# Patient Record
Sex: Female | Born: 1955 | Race: Black or African American | Hispanic: No | Marital: Single | Smoking: Never smoker
Health system: Southern US, Community
[De-identification: ages and names within clinical notes are randomized; demographics above are authoritative.]

## PROBLEM LIST (undated history)

## (undated) DIAGNOSIS — J45909 Unspecified asthma, uncomplicated: Secondary | ICD-10-CM

## (undated) DIAGNOSIS — I10 Essential (primary) hypertension: Secondary | ICD-10-CM

## (undated) HISTORY — PX: ABDOMINAL HYSTERECTOMY: SHX81

---

## 2002-01-13 DIAGNOSIS — C801 Malignant (primary) neoplasm, unspecified: Secondary | ICD-10-CM

## 2002-01-13 HISTORY — DX: Malignant (primary) neoplasm, unspecified: C80.1

## 2018-10-26 ENCOUNTER — Emergency Department (HOSPITAL_BASED_OUTPATIENT_CLINIC_OR_DEPARTMENT_OTHER): Payer: Commercial Managed Care - PPO

## 2018-10-26 ENCOUNTER — Emergency Department (HOSPITAL_BASED_OUTPATIENT_CLINIC_OR_DEPARTMENT_OTHER)
Admission: EM | Admit: 2018-10-26 | Discharge: 2018-10-26 | Disposition: A | Discharge status: Home / Self Care | Payer: Commercial Managed Care - PPO | Attending: Emergency Medicine | Admitting: Emergency Medicine

## 2018-10-26 ENCOUNTER — Encounter (HOSPITAL_BASED_OUTPATIENT_CLINIC_OR_DEPARTMENT_OTHER): Payer: Self-pay

## 2018-10-26 ENCOUNTER — Other Ambulatory Visit: Payer: Self-pay

## 2018-10-26 DIAGNOSIS — J069 Acute upper respiratory infection, unspecified: Secondary | ICD-10-CM | POA: Diagnosis not present

## 2018-10-26 DIAGNOSIS — Z20828 Contact with and (suspected) exposure to other viral communicable diseases: Secondary | ICD-10-CM | POA: Diagnosis not present

## 2018-10-26 DIAGNOSIS — R05 Cough: Secondary | ICD-10-CM | POA: Diagnosis present

## 2018-10-26 MED ORDER — BENZONATATE 100 MG PO CAPS: 100.0000 mg | ORAL_CAPSULE | Freq: Three times a day (TID) | ORAL | 0 refills | Status: DC

## 2018-10-26 NOTE — ED Triage Notes (Signed)
pt c/o flu like sx x 4 days--seen at [facility] last week-rx "steroids"-NAD-steady gait

## 2018-10-26 NOTE — ED Notes (Signed)
Port chest done in 14

## 2018-10-26 NOTE — ED Provider Notes (Signed)
[facility] Provider Note   CSN: [id number] Arrival date & time: 10/26/18  1858     History   Chief Complaint Chief Complaint  Patient presents with  . Cough    HPI Jaime Thomas is a 63 y.o. female.     62 yo F with a chief complaints of cough, congestion and myalgias.  Going on for about 4 days now.  Was seen in urgent care at the onset of this and was prescribed to a steroid Dosepak.  She has not had any improvement with this.  No fevers no shortness of breath no nausea vomiting or diarrhea.  The history is provided by the patient.  Cough Associated symptoms: myalgias   Associated symptoms: no chest pain, no chills, no fever, no headaches, no rhinorrhea, no shortness of breath and no wheezing   Illness Severity:  Moderate Onset quality:  Gradual Duration:  4 days Timing:  Constant Progression:  Worsening Chronicity:  New Associated symptoms: congestion, cough and myalgias   Associated symptoms: no chest pain, no fever, no headaches, no nausea, no rhinorrhea, no shortness of breath, no vomiting and no wheezing     History reviewed. No pertinent past medical history.  There are no active problems to display for this patient.   Past Surgical History:  Procedure Laterality Date  . ABDOMINAL HYSTERECTOMY       OB History   No obstetric history on file.      Home Medications    Prior to Admission medications   Medication Sig Start Date End Date Taking? Authorizing Provider  benzonatate (TESSALON) 100 MG capsule Take 1 capsule (100 mg total) by mouth every 8 (eight) hours. 10/26/18   Deno Etienne, DO    Family History No family history on file.  Social History Social History   Tobacco Use  . Smoking status: Never Smoker  . Smokeless tobacco: Never Used  Substance Use Topics  . Alcohol use: Never    Frequency: Never  . Drug use: Never     Allergies   Patient has no known allergies.   Review of Systems Review of  Systems  Constitutional: Negative for chills and fever.  HENT: Positive for congestion. Negative for rhinorrhea.   Eyes: Negative for redness and visual disturbance.  Respiratory: Positive for cough. Negative for shortness of breath and wheezing.   Cardiovascular: Negative for chest pain and palpitations.  Gastrointestinal: Negative for nausea and vomiting.  Genitourinary: Negative for dysuria and urgency.  Musculoskeletal: Positive for myalgias. Negative for arthralgias.  Skin: Negative for pallor and wound.  Neurological: Negative for dizziness and headaches.     Physical Exam Updated Vital Signs BP (!) 155/98 (BP Location: Left Arm)   Pulse 84   Temp 98.4 F (36.9 C) (Oral)   Resp 20   Ht 5\' 9"  (1.753 m)   Wt 93.4 kg   SpO2 98%   BMI 30.42 kg/m   Physical Exam Vitals signs and nursing note reviewed.  Constitutional:      General: She is not in acute distress.    Appearance: She is well-developed. She is not diaphoretic.  HENT:     Head: Normocephalic and atraumatic.     Comments: Swollen turbinates, posterior nasal drip, no noted sinus ttp, tm normal bilaterally.   Eyes:     Pupils: Pupils are equal, round, and reactive to light.  Neck:     Musculoskeletal: Normal range of motion and neck supple.  Cardiovascular:  Rate and Rhythm: Normal rate and regular rhythm.     Heart sounds: No murmur. No friction rub. No gallop.   Pulmonary:     Effort: Pulmonary effort is normal.     Breath sounds: Rhonchi (lll) present. No wheezing or rales.  Abdominal:     General: There is no distension.     Palpations: Abdomen is soft.     Tenderness: There is no abdominal tenderness.  Musculoskeletal:        General: No tenderness.  Skin:    General: Skin is warm and dry.  Neurological:     Mental Status: She is alert and oriented to person, place, and time.  Psychiatric:        Behavior: Behavior normal.      ED Treatments / Results  Labs (all labs ordered are listed,  but only abnormal results are displayed) Labs Reviewed - No data to display  EKG None  Radiology Dg Chest [facility] 1 View  Result Date: 10/26/2018 CLINICAL DATA:  63 year old female with shortness of breath and abnormal left lower lobe auscultation. EXAM: PORTABLE CHEST 1 VIEW COMPARISON:  None. FINDINGS: Portable AP semi upright view at 2121 hours. Mildly low lung volumes. Mild diffuse increased interstitial markings. Otherwise allowing for portable technique both lungs appear clear. Normal cardiac size and mediastinal contours. Visualized tracheal air column is within normal limits. No pneumothorax. Negative visible osseous structures. IMPRESSION: Mild increased pulmonary interstitial markings which might be chronic. Consider viral/atypical respiratory infection. No other acute cardiopulmonary abnormality. Electronically Signed   By: Genevie Ann M.D.   On: 10/26/2018 21:36    Procedures Procedures (including critical care time)  Medications Ordered in ED Medications - No data to display   Initial Impression / Assessment and Plan / ED Course  I have reviewed the triage vital signs and the nursing notes.  Pertinent labs & imaging results that were available during my care of the patient were reviewed by me and considered in my medical decision making (see chart for details).        63 yo F with a chief complaints of cough congestion and myalgias.  Going on for about 4 days.  Some left lower lobe rhonchi on exam.  Will obtain a plain film.  As this is occurring during the novel coronavirus pandemic this could be possible cause of her symptoms.  We will send an outpatient test.  Patient on reassessment is declining a cover test.  I wonder if there is some sort of stigma that goes along with a positive test.  Her chest x-ray shows a likely viral pneumonia.  No focal infiltrates.  We will have the patient self isolate at home.  PCP follow-up.  [facility] was evaluated in [facility] on  10/26/2018 for the symptoms described in the history of present illness. He/she was evaluated in the context of the global COVID-19 pandemic, which necessitated consideration that the patient might be at risk for infection with the SARS-CoV-2 virus that causes COVID-19. Institutional protocols and algorithms that pertain to the evaluation of patients at risk for COVID-19 are in a state of rapid change based on information released by regulatory bodies including the CDC and federal and state organizations. These policies and algorithms were followed during the patient's care in the ED.  9:54 PM:  I have discussed the diagnosis/risks/treatment options with the patient and family and believe the pt to be eligible for discharge home to follow-up with PCP. We also discussed returning to  the ED immediately if new or worsening sx occur. We discussed the sx which are most concerning (e.g., sudden worsening pain, fever, inability to tolerate by mouth) that necessitate immediate return. Medications administered to the patient during their visit and any new prescriptions provided to the patient are listed below.  Medications given during this visit Medications - No data to display   The patient appears reasonably screen and/or stabilized for discharge and I doubt any other medical condition or other [facility] requiring further screening, evaluation, or treatment in the ED at this time prior to discharge.    Final Clinical Impressions(s) / ED Diagnoses   Final diagnoses:  Viral URI with cough    ED Discharge Orders         Ordered    benzonatate (TESSALON) 100 MG capsule  Every 8 hours     10/26/18 2148           Deno Etienne, DO 10/26/18 2154

## 2018-10-26 NOTE — Discharge Instructions (Signed)
Take tylenol 2 pills 4 times a day and motrin 4 pills 3 times a day.  Drink plenty of fluids.  Return for worsening shortness of breath, headache, confusion. Follow up with your family doctor.   Your chest x-ray looked typical of a viral infection.  There is no obvious pneumonia.  During the middle of the pandemic this is concerning that you could have the novel coronavirus.  Please try to social distance yourself from other people especially those with immune suppression of those at extremes of age.     Person Under Monitoring Name: Jaime [facility]  Location: [address] [place] [id number]   Infection Prevention Recommendations for Individuals Confirmed to have, or Being Evaluated for, 2019 Novel Coronavirus (COVID-19) Infection Who Receive Care at Home  Individuals who are confirmed to have, or are being evaluated for, COVID-19 should follow the prevention steps below until a healthcare provider or local or state health department says they can return to normal activities.  Stay home except to get medical care You should restrict activities outside your home, except for getting medical care. Do not go to work, school, or public areas, and do not use public transportation or taxis.  Call ahead before visiting your doctor Before your medical appointment, call the healthcare provider and tell them that you have, or are being evaluated for, COVID-19 infection. This will help the healthcare providers office take steps to keep other people from getting infected. Ask your healthcare provider to call the local or state health department.  Monitor your symptoms Seek prompt medical attention if your illness is worsening (e.g., difficulty breathing). Before going to your medical appointment, call the healthcare provider and tell them that you have, or are being evaluated for, COVID-19 infection. Ask your healthcare provider to call the local or state health department.  Wear a  facemask You should wear a facemask that covers your nose and mouth when you are in the same room with other people and when you visit a healthcare provider. People who live with or visit you should also wear a facemask while they are in the same room with you.  Separate yourself from other people in your home As much as possible, you should stay in a different room from other people in your home. Also, you should use a separate bathroom, if available.  Avoid sharing household items You should not share dishes, drinking glasses, cups, eating utensils, towels, bedding, or other items with other people in your home. After using these items, you should wash them thoroughly with soap and water.  Cover your coughs and sneezes Cover your mouth and nose with a tissue when you cough or sneeze, or you can cough or sneeze into your sleeve. Throw used tissues in a lined trash can, and immediately wash your hands with soap and water for at least 20 seconds or use an alcohol-based hand rub.  Wash your [facility] your hands often and thoroughly with soap and water for at least 20 seconds. You can use an alcohol-based hand sanitizer if soap and water are not available and if your hands are not visibly dirty. Avoid touching your eyes, nose, and mouth with unwashed hands.   Prevention Steps for Caregivers and Household Members of Individuals Confirmed to have, or Being Evaluated for, COVID-19 Infection Being Cared for in the Home  If you live with, or provide care at home for, a person confirmed to have, or being evaluated for, COVID-19 infection please follow these  guidelines to prevent infection:  Follow healthcare providers instructions Make sure that you understand and can help the patient follow any healthcare provider instructions for all care.  Provide for the patients basic needs You should help the patient with basic needs in the home and provide support for getting groceries,  prescriptions, and other personal needs.  Monitor the patients symptoms If they are getting sicker, call his or her medical provider and tell them that the patient has, or is being evaluated for, COVID-19 infection. This will help the healthcare providers office take steps to keep other people from getting infected. Ask the healthcare provider to call the local or state health department.  Limit the number of people who have contact with the patient If possible, have only one caregiver for the patient. Other household members should stay in another home or place of residence. If this is not possible, they should stay in another room, or be separated from the patient as much as possible. Use a separate bathroom, if available. Restrict visitors who do not have an essential need to be in the home.  Keep older adults, very young children, and other sick people away from the patient Keep older adults, very young children, and those who have compromised immune systems or chronic health conditions away from the patient. This includes people with chronic heart, lung, or kidney conditions, diabetes, and cancer.  Ensure good ventilation Make sure that shared spaces in the home have good air flow, such as from an air conditioner or an opened window, weather permitting.  Wash your hands often Wash your hands often and thoroughly with soap and water for at least 20 seconds. You can use an alcohol based hand sanitizer if soap and water are not available and if your hands are not visibly dirty. Avoid touching your eyes, nose, and mouth with unwashed hands. Use disposable paper towels to dry your hands. If not available, use dedicated cloth towels and replace them when they become wet.  Wear a facemask and gloves Wear a disposable facemask at all times in the room and gloves when you touch or have contact with the patients blood, body fluids, and/or secretions or excretions, such as sweat, saliva,  sputum, nasal mucus, vomit, urine, or feces.  Ensure the mask fits over your nose and mouth tightly, and do not touch it during use. Throw out disposable facemasks and gloves after using them. Do not reuse. Wash your hands immediately after removing your facemask and gloves. If your personal clothing becomes contaminated, carefully remove clothing and launder. Wash your hands after handling contaminated clothing. Place all used disposable facemasks, gloves, and other waste in a lined container before disposing them with other household waste. Remove gloves and wash your hands immediately after handling these items.  Do not share dishes, glasses, or other household items with the patient Avoid sharing household items. You should not share dishes, drinking glasses, cups, eating utensils, towels, bedding, or other items with a patient who is confirmed to have, or being evaluated for, COVID-19 infection. After the person uses these items, you should wash them thoroughly with soap and water.  Wash laundry thoroughly Immediately remove and wash clothes or bedding that have blood, body fluids, and/or secretions or excretions, such as sweat, saliva, sputum, nasal mucus, vomit, urine, or feces, on them. Wear gloves when handling laundry from the patient. Read and follow directions on labels of laundry or clothing items and detergent. In general, wash and dry with the warmest temperatures  recommended on the label.  Clean all areas the individual has used often Clean all touchable surfaces, such as counters, tabletops, doorknobs, bathroom fixtures, toilets, phones, keyboards, tablets, and bedside tables, every day. Also, clean any surfaces that may have blood, body fluids, and/or secretions or excretions on them. Wear gloves when cleaning surfaces the patient has come in contact with. Use a diluted bleach solution (e.g., dilute bleach with 1 part bleach and 10 parts water) or a household disinfectant with a  label that says EPA-registered for coronaviruses. To make a bleach solution at home, add 1 tablespoon of bleach to 1 quart (4 cups) of water. For a larger supply, add  cup of bleach to 1 gallon (16 cups) of water. Read labels of cleaning products and follow recommendations provided on product labels. Labels contain instructions for safe and effective use of the cleaning product including precautions you should take when applying the product, such as wearing gloves or eye protection and making sure you have good ventilation during use of the product. Remove gloves and wash hands immediately after cleaning.  Monitor yourself for signs and symptoms of illness Caregivers and household members are considered close contacts, should monitor their health, and will be asked to limit movement outside of the home to the extent possible. Follow the monitoring steps for close contacts listed on the symptom monitoring form.   ? If you have additional questions, contact your local health department or call the epidemiologist on call at [phone] (available 24/7). ? This guidance is subject to change. For the most up-to-date guidance from [facility], please refer to their website: [email or web address]

## 2018-10-28 LAB — NOVEL CORONAVIRUS, NAA (HOSP ORDER, SEND-OUT TO REF LAB; TAT 18-24 HRS): SARS-CoV-2, NAA: NOT DETECTED

## 2021-01-16 ENCOUNTER — Encounter (HOSPITAL_BASED_OUTPATIENT_CLINIC_OR_DEPARTMENT_OTHER): Payer: Self-pay | Admitting: *Deleted

## 2021-01-16 ENCOUNTER — Other Ambulatory Visit: Payer: Self-pay

## 2021-01-16 ENCOUNTER — Observation Stay (HOSPITAL_BASED_OUTPATIENT_CLINIC_OR_DEPARTMENT_OTHER)
Admission: EM | Admit: 2021-01-16 | Discharge: 2021-01-18 | Disposition: A | Discharge status: Home / Self Care | Payer: Commercial Managed Care - PPO | Attending: Surgery | Admitting: Surgery

## 2021-01-16 DIAGNOSIS — K8019 Calculus of gallbladder with other cholecystitis with obstruction: Secondary | ICD-10-CM | POA: Diagnosis not present

## 2021-01-16 DIAGNOSIS — K819 Cholecystitis, unspecified: Secondary | ICD-10-CM

## 2021-01-16 DIAGNOSIS — F17210 Nicotine dependence, cigarettes, uncomplicated: Secondary | ICD-10-CM | POA: Insufficient documentation

## 2021-01-16 DIAGNOSIS — I10 Essential (primary) hypertension: Secondary | ICD-10-CM | POA: Insufficient documentation

## 2021-01-16 DIAGNOSIS — Z20822 Contact with and (suspected) exposure to covid-19: Secondary | ICD-10-CM | POA: Insufficient documentation

## 2021-01-16 DIAGNOSIS — R1010 Upper abdominal pain, unspecified: Secondary | ICD-10-CM | POA: Diagnosis present

## 2021-01-16 DIAGNOSIS — Z79899 Other long term (current) drug therapy: Secondary | ICD-10-CM | POA: Diagnosis not present

## 2021-01-16 DIAGNOSIS — K8020 Calculus of gallbladder without cholecystitis without obstruction: Secondary | ICD-10-CM

## 2021-01-16 DIAGNOSIS — K5792 Diverticulitis of intestine, part unspecified, without perforation or abscess without bleeding: Secondary | ICD-10-CM | POA: Diagnosis not present

## 2021-01-16 DIAGNOSIS — K8010 Calculus of gallbladder with chronic cholecystitis without obstruction: Secondary | ICD-10-CM | POA: Diagnosis present

## 2021-01-16 HISTORY — DX: Essential (primary) hypertension: I10

## 2021-01-16 LAB — COMPREHENSIVE METABOLIC PANEL
ALT: 14 U/L (ref 0–44)
AST: 17 U/L (ref 15–41)
Albumin: 4.1 g/dL (ref 3.5–5.0)
Alkaline Phosphatase: 87 U/L (ref 38–126)
Anion gap: 14 (ref 5–15)
BUN: 12 mg/dL (ref 8–23)
CO2: 21 mmol/L — ABNORMAL LOW (ref 22–32)
Calcium: 9.6 mg/dL (ref 8.9–10.3)
Chloride: 102 mmol/L (ref 98–111)
Creatinine, Ser: 0.79 mg/dL (ref 0.44–1.00)
GFR, Estimated: >60 mL/min (ref >60)
Glucose, Bld: 144 mg/dL — ABNORMAL HIGH (ref 70–99)
Potassium: 3.2 mmol/L — ABNORMAL LOW (ref 3.5–5.1)
Sodium: 137 mmol/L (ref 135–145)
Total Bilirubin: 0.3 mg/dL (ref 0.3–1.2)
Total Protein: 7.9 g/dL (ref 6.5–8.1)

## 2021-01-16 LAB — CBC
HCT: 41.4 % (ref 36.0–46.0)
Hemoglobin: 14 g/dL (ref 12.0–15.0)
MCH: 26.5 pg (ref 26.0–34.0)
MCHC: 33.8 g/dL (ref 30.0–36.0)
MCV: 78.4 fL — ABNORMAL LOW (ref 80.0–100.0)
Platelets: 344 10*3/uL (ref 150–400)
RBC: 5.28 MIL/uL — ABNORMAL HIGH (ref 3.87–5.11)
RDW: 13.6 % (ref 11.5–15.5)
WBC: 10.2 10*3/uL (ref 4.0–10.5)
nRBC: 0 % (ref 0.0–0.2)

## 2021-01-16 LAB — LIPASE, BLOOD: Lipase: 34 U/L (ref 11–51)

## 2021-01-16 MED ORDER — ONDANSETRON 4 MG PO TBDP
4.0000 mg | ORAL_TABLET | Freq: Once | ORAL | Status: AC
  Administered 2021-01-16: 4 mg via ORAL
  Filled 2021-01-16: qty 1

## 2021-01-16 NOTE — ED Triage Notes (Signed)
C/o abd pain, lower back pain and vomiting x 4 hrs

## 2021-01-17 ENCOUNTER — Observation Stay (HOSPITAL_COMMUNITY): Payer: Commercial Managed Care - PPO | Admitting: Anesthesiology

## 2021-01-17 ENCOUNTER — Inpatient Hospital Stay: Admit: 2021-01-17 | Payer: Commercial Managed Care - PPO | Admitting: Surgery

## 2021-01-17 ENCOUNTER — Encounter (HOSPITAL_COMMUNITY)
Admission: EM | Disposition: A | Discharge status: Home / Self Care | Payer: Self-pay | Source: Home / Self Care | Attending: Emergency Medicine

## 2021-01-17 ENCOUNTER — Emergency Department (HOSPITAL_BASED_OUTPATIENT_CLINIC_OR_DEPARTMENT_OTHER): Payer: Commercial Managed Care - PPO

## 2021-01-17 ENCOUNTER — Encounter (HOSPITAL_COMMUNITY): Payer: Self-pay

## 2021-01-17 DIAGNOSIS — K5792 Diverticulitis of intestine, part unspecified, without perforation or abscess without bleeding: Secondary | ICD-10-CM | POA: Diagnosis not present

## 2021-01-17 DIAGNOSIS — K8010 Calculus of gallbladder with chronic cholecystitis without obstruction: Secondary | ICD-10-CM | POA: Diagnosis present

## 2021-01-17 DIAGNOSIS — I10 Essential (primary) hypertension: Secondary | ICD-10-CM | POA: Diagnosis not present

## 2021-01-17 DIAGNOSIS — F17210 Nicotine dependence, cigarettes, uncomplicated: Secondary | ICD-10-CM | POA: Diagnosis not present

## 2021-01-17 DIAGNOSIS — Z79899 Other long term (current) drug therapy: Secondary | ICD-10-CM | POA: Diagnosis not present

## 2021-01-17 DIAGNOSIS — Z20822 Contact with and (suspected) exposure to covid-19: Secondary | ICD-10-CM | POA: Diagnosis not present

## 2021-01-17 DIAGNOSIS — R1010 Upper abdominal pain, unspecified: Secondary | ICD-10-CM | POA: Diagnosis present

## 2021-01-17 DIAGNOSIS — K8019 Calculus of gallbladder with other cholecystitis with obstruction: Secondary | ICD-10-CM | POA: Diagnosis not present

## 2021-01-17 HISTORY — PX: CHOLECYSTECTOMY: SHX55

## 2021-01-17 LAB — URINALYSIS, MICROSCOPIC (REFLEX)

## 2021-01-17 LAB — URINALYSIS, ROUTINE W REFLEX MICROSCOPIC
Bilirubin Urine: NEGATIVE
Glucose, UA: NEGATIVE mg/dL
Hgb urine dipstick: NEGATIVE
Ketones, ur: NEGATIVE mg/dL
Nitrite: NEGATIVE
Protein, ur: NEGATIVE mg/dL
Specific Gravity, Urine: 1.02 (ref 1.005–1.030)
pH: 8.5 — ABNORMAL HIGH (ref 5.0–8.0)

## 2021-01-17 LAB — HIV ANTIBODY (ROUTINE TESTING W REFLEX): HIV Screen 4th Generation wRfx: NONREACTIVE

## 2021-01-17 LAB — RESP PANEL BY RT-PCR (FLU A&B, COVID) ARPGX2
Influenza A by PCR: NEGATIVE
Influenza B by PCR: NEGATIVE
SARS Coronavirus 2 by RT PCR: NEGATIVE

## 2021-01-17 SURGERY — LAPAROSCOPIC CHOLECYSTECTOMY WITH INTRAOPERATIVE CHOLANGIOGRAM
Anesthesia: General | Site: Abdomen

## 2021-01-17 MED ORDER — ENOXAPARIN SODIUM 40 MG/0.4ML IJ SOSY
40.0000 mg | PREFILLED_SYRINGE | INTRAMUSCULAR | Status: DC
  Administered 2021-01-18: 40 mg via SUBCUTANEOUS
  Filled 2021-01-17: qty 0.4

## 2021-01-17 MED ORDER — 0.9 % SODIUM CHLORIDE (POUR BTL) OPTIME
TOPICAL | Status: DC | PRN
  Administered 2021-01-17: 1000 mL

## 2021-01-17 MED ORDER — FENTANYL CITRATE PF 50 MCG/ML IJ SOSY
75.0000 ug | PREFILLED_SYRINGE | Freq: Once | INTRAMUSCULAR | Status: AC
  Administered 2021-01-17: 75 ug via INTRAVENOUS
  Filled 2021-01-17: qty 2

## 2021-01-17 MED ORDER — FENTANYL CITRATE (PF) 250 MCG/5ML IJ SOLN
INTRAMUSCULAR | Status: DC | PRN
  Administered 2021-01-17 (×5): 50 ug via INTRAVENOUS

## 2021-01-17 MED ORDER — FENTANYL CITRATE (PF) 250 MCG/5ML IJ SOLN
INTRAMUSCULAR | Status: AC
  Filled 2021-01-17: qty 5

## 2021-01-17 MED ORDER — PROPOFOL 10 MG/ML IV BOLUS
INTRAVENOUS | Status: DC | PRN
  Administered 2021-01-17: 150 mg via INTRAVENOUS

## 2021-01-17 MED ORDER — OXYCODONE HCL 5 MG PO TABS: 5.0000 mg | ORAL_TABLET | Freq: Once | ORAL | Status: DC | PRN

## 2021-01-17 MED ORDER — OXYCODONE HCL 5 MG/5ML PO SOLN: 5.0000 mg | Freq: Once | ORAL | Status: DC | PRN

## 2021-01-17 MED ORDER — SODIUM CHLORIDE 0.9 % IV SOLN
2.0000 g | Freq: Once | INTRAVENOUS | Status: AC
  Administered 2021-01-17: 2 g via INTRAVENOUS
  Filled 2021-01-17: qty 20

## 2021-01-17 MED ORDER — DIPHENHYDRAMINE HCL 12.5 MG/5ML PO ELIX: 12.5000 mg | ORAL_SOLUTION | Freq: Four times a day (QID) | ORAL | Status: DC | PRN

## 2021-01-17 MED ORDER — MIDAZOLAM HCL 2 MG/2ML IJ SOLN
INTRAMUSCULAR | Status: DC | PRN
  Administered 2021-01-17: 2 mg via INTRAVENOUS

## 2021-01-17 MED ORDER — DIPHENHYDRAMINE HCL 50 MG/ML IJ SOLN: 12.5000 mg | Freq: Four times a day (QID) | INTRAMUSCULAR | Status: DC | PRN

## 2021-01-17 MED ORDER — HYDROCHLOROTHIAZIDE 25 MG PO TABS
25.0000 mg | ORAL_TABLET | Freq: Every day | ORAL | Status: DC
Start: 2021-01-18 — End: 2021-01-18
  Administered 2021-01-18: 25 mg via ORAL
  Filled 2021-01-17: qty 1

## 2021-01-17 MED ORDER — ONDANSETRON HCL 4 MG/2ML IJ SOLN
4.0000 mg | Freq: Once | INTRAMUSCULAR | Status: AC
  Administered 2021-01-17: 4 mg via INTRAVENOUS
  Filled 2021-01-17: qty 2

## 2021-01-17 MED ORDER — HYDROMORPHONE HCL 1 MG/ML IJ SOLN
0.5000 mg | INTRAMUSCULAR | Status: DC | PRN
  Administered 2021-01-17: 0.5 mg via INTRAVENOUS
  Filled 2021-01-17: qty 1

## 2021-01-17 MED ORDER — PROMETHAZINE HCL 25 MG/ML IJ SOLN: 6.2500 mg | INTRAMUSCULAR | Status: DC | PRN

## 2021-01-17 MED ORDER — HYDROMORPHONE HCL 1 MG/ML IJ SOLN
0.5000 mg | Freq: Once | INTRAMUSCULAR | Status: AC
  Administered 2021-01-17: 0.5 mg via INTRAVENOUS
  Filled 2021-01-17: qty 1

## 2021-01-17 MED ORDER — KCL IN DEXTROSE-NACL 20-5-0.45 MEQ/L-%-% IV SOLN
INTRAVENOUS | Status: DC
  Filled 2021-01-17: qty 1000

## 2021-01-17 MED ORDER — MIDAZOLAM HCL 2 MG/2ML IJ SOLN
INTRAMUSCULAR | Status: AC
  Filled 2021-01-17: qty 2

## 2021-01-17 MED ORDER — TRAZODONE HCL 50 MG PO TABS
50.0000 mg | ORAL_TABLET | Freq: Every day | ORAL | Status: DC
Start: 2021-01-17 — End: 2021-01-18
  Administered 2021-01-17: 50 mg via ORAL
  Filled 2021-01-17: qty 1

## 2021-01-17 MED ORDER — SUGAMMADEX SODIUM 200 MG/2ML IV SOLN
INTRAVENOUS | Status: DC | PRN
  Administered 2021-01-17: 200 mg via INTRAVENOUS

## 2021-01-17 MED ORDER — CHLORHEXIDINE GLUCONATE 0.12 % MT SOLN
15.0000 mL | Freq: Once | OROMUCOSAL | Status: AC
  Administered 2021-01-17: 15 mL via OROMUCOSAL

## 2021-01-17 MED ORDER — ONDANSETRON 4 MG PO TBDP: 4.0000 mg | ORAL_TABLET | Freq: Four times a day (QID) | ORAL | Status: DC | PRN

## 2021-01-17 MED ORDER — SODIUM CHLORIDE 0.9 % IV SOLN: 2.0000 g | INTRAVENOUS | Status: DC

## 2021-01-17 MED ORDER — OXYCODONE HCL 5 MG PO TABS: 5.0000 mg | ORAL_TABLET | ORAL | Status: DC | PRN

## 2021-01-17 MED ORDER — DEXAMETHASONE SODIUM PHOSPHATE 10 MG/ML IJ SOLN
INTRAMUSCULAR | Status: AC
  Filled 2021-01-17: qty 1

## 2021-01-17 MED ORDER — HYDRALAZINE HCL 20 MG/ML IJ SOLN: 10.0000 mg | INTRAMUSCULAR | Status: DC | PRN

## 2021-01-17 MED ORDER — BUPIVACAINE-EPINEPHRINE 0.25% -1:200000 IJ SOLN
INTRAMUSCULAR | Status: DC | PRN
  Administered 2021-01-17: 30 mL

## 2021-01-17 MED ORDER — ONDANSETRON HCL 4 MG/2ML IJ SOLN
INTRAMUSCULAR | Status: DC | PRN
  Administered 2021-01-17: 4 mg via INTRAVENOUS

## 2021-01-17 MED ORDER — HYDROMORPHONE HCL 1 MG/ML IJ SOLN: 0.2500 mg | INTRAMUSCULAR | Status: DC | PRN

## 2021-01-17 MED ORDER — LACTATED RINGERS IR SOLN
Status: DC | PRN
  Administered 2021-01-17: 1000 mL

## 2021-01-17 MED ORDER — LACTATED RINGERS IV SOLN: INTRAVENOUS | Status: DC

## 2021-01-17 MED ORDER — MONTELUKAST SODIUM 10 MG PO TABS
10.0000 mg | ORAL_TABLET | Freq: Every day | ORAL | Status: DC
  Administered 2021-01-17: 10 mg via ORAL
  Filled 2021-01-17: qty 1

## 2021-01-17 MED ORDER — ROCURONIUM BROMIDE 10 MG/ML (PF) SYRINGE
PREFILLED_SYRINGE | INTRAVENOUS | Status: DC | PRN
  Administered 2021-01-17: 10 mg via INTRAVENOUS
  Administered 2021-01-17: 40 mg via INTRAVENOUS

## 2021-01-17 MED ORDER — ALBUTEROL SULFATE (2.5 MG/3ML) 0.083% IN NEBU: 2.5000 mg | INHALATION_SOLUTION | Freq: Four times a day (QID) | RESPIRATORY_TRACT | Status: DC | PRN

## 2021-01-17 MED ORDER — SCOPOLAMINE 1 MG/3DAYS TD PT72: 1.0000 | MEDICATED_PATCH | TRANSDERMAL | Status: DC

## 2021-01-17 MED ORDER — OXYCODONE HCL 5 MG PO TABS
5.0000 mg | ORAL_TABLET | ORAL | Status: DC | PRN
  Administered 2021-01-18: 10 mg via ORAL
  Filled 2021-01-17: qty 2

## 2021-01-17 MED ORDER — SODIUM CHLORIDE 0.9 % IV BOLUS
1000.0000 mL | Freq: Once | INTRAVENOUS | Status: AC
  Administered 2021-01-17: 1000 mL via INTRAVENOUS

## 2021-01-17 MED ORDER — PROPOFOL 10 MG/ML IV BOLUS
INTRAVENOUS | Status: AC
  Filled 2021-01-17: qty 20

## 2021-01-17 MED ORDER — IOHEXOL 300 MG/ML  SOLN
100.0000 mL | Freq: Once | INTRAMUSCULAR | Status: AC | PRN
  Administered 2021-01-17: 100 mL via INTRAVENOUS

## 2021-01-17 MED ORDER — ACETAMINOPHEN 500 MG PO TABS: 1000.0000 mg | ORAL_TABLET | Freq: Once | ORAL | Status: DC

## 2021-01-17 MED ORDER — SODIUM CHLORIDE 0.9 % IV BOLUS (SEPSIS)
1000.0000 mL | Freq: Once | INTRAVENOUS | Status: AC
  Administered 2021-01-17: 1000 mL via INTRAVENOUS

## 2021-01-17 MED ORDER — MIDAZOLAM HCL 2 MG/2ML IJ SOLN: 0.5000 mg | Freq: Once | INTRAMUSCULAR | Status: DC | PRN

## 2021-01-17 MED ORDER — ACETAMINOPHEN 500 MG PO TABS
1000.0000 mg | ORAL_TABLET | Freq: Four times a day (QID) | ORAL | Status: DC
  Administered 2021-01-17 – 2021-01-18 (×3): 1000 mg via ORAL
  Filled 2021-01-17 (×3): qty 2

## 2021-01-17 MED ORDER — MEPERIDINE HCL 50 MG/ML IJ SOLN: 6.2500 mg | INTRAMUSCULAR | Status: DC | PRN

## 2021-01-17 MED ORDER — SODIUM CHLORIDE 0.9 % IV SOLN
1000.0000 mL | INTRAVENOUS | Status: DC
  Administered 2021-01-17: 1000 mL via INTRAVENOUS

## 2021-01-17 MED ORDER — LIDOCAINE 2% (20 MG/ML) 5 ML SYRINGE
INTRAMUSCULAR | Status: DC | PRN
  Administered 2021-01-17: 40 mg via INTRAVENOUS

## 2021-01-17 MED ORDER — SUCCINYLCHOLINE CHLORIDE 200 MG/10ML IV SOSY
PREFILLED_SYRINGE | INTRAVENOUS | Status: DC | PRN
Start: 2021-01-17 — End: 2021-01-17
  Administered 2021-01-17: 80 mg via INTRAVENOUS

## 2021-01-17 MED ORDER — DEXAMETHASONE SODIUM PHOSPHATE 10 MG/ML IJ SOLN
INTRAMUSCULAR | Status: DC | PRN
  Administered 2021-01-17: 10 mg via INTRAVENOUS

## 2021-01-17 MED ORDER — ONDANSETRON HCL 4 MG/2ML IJ SOLN
INTRAMUSCULAR | Status: AC
  Filled 2021-01-17: qty 2

## 2021-01-17 MED ORDER — METRONIDAZOLE 500 MG/100ML IV SOLN
500.0000 mg | Freq: Once | INTRAVENOUS | Status: AC
  Administered 2021-01-17: 500 mg via INTRAVENOUS
  Filled 2021-01-17: qty 100

## 2021-01-17 MED ORDER — ALUM & MAG HYDROXIDE-SIMETH 200-200-20 MG/5ML PO SUSP
30.0000 mL | Freq: Four times a day (QID) | ORAL | Status: DC | PRN
  Administered 2021-01-17: 30 mL via ORAL
  Filled 2021-01-17: qty 30

## 2021-01-17 MED ORDER — LIDOCAINE HCL (PF) 2 % IJ SOLN
INTRAMUSCULAR | Status: AC
  Filled 2021-01-17: qty 5

## 2021-01-17 MED ORDER — ATORVASTATIN CALCIUM 10 MG PO TABS
10.0000 mg | ORAL_TABLET | Freq: Every day | ORAL | Status: DC
  Administered 2021-01-18: 10 mg via ORAL
  Filled 2021-01-17: qty 1

## 2021-01-17 MED ORDER — ROCURONIUM BROMIDE 10 MG/ML (PF) SYRINGE
PREFILLED_SYRINGE | INTRAVENOUS | Status: AC
  Filled 2021-01-17: qty 10

## 2021-01-17 MED ORDER — METRONIDAZOLE 500 MG/100ML IV SOLN: 500.0000 mg | Freq: Two times a day (BID) | INTRAVENOUS | Status: DC

## 2021-01-17 MED ORDER — BUPIVACAINE-EPINEPHRINE (PF) 0.25% -1:200000 IJ SOLN
INTRAMUSCULAR | Status: AC
  Filled 2021-01-17: qty 30

## 2021-01-17 MED ORDER — ONDANSETRON HCL 4 MG/2ML IJ SOLN: 4.0000 mg | Freq: Four times a day (QID) | INTRAMUSCULAR | Status: DC | PRN

## 2021-01-17 MED ORDER — HYDROMORPHONE HCL 1 MG/ML IJ SOLN: 0.5000 mg | INTRAMUSCULAR | Status: DC | PRN

## 2021-01-17 SURGICAL SUPPLY — 42 items
APPLIER CLIP 5 13 M/L LIGAMAX5 (MISCELLANEOUS) ×2
BAG COUNTER SPONGE SURGICOUNT (BAG) IMPLANT
CHLORAPREP W/TINT 26 (MISCELLANEOUS) ×2 IMPLANT
CLIP APPLIE 5 13 M/L LIGAMAX5 (MISCELLANEOUS) ×1 IMPLANT
COVER SURGICAL LIGHT HANDLE (MISCELLANEOUS) ×2 IMPLANT
DECANTER SPIKE VIAL GLASS SM (MISCELLANEOUS) ×2 IMPLANT
DERMABOND ADVANCED (GAUZE/BANDAGES/DRESSINGS) ×1
DERMABOND ADVANCED .7 DNX12 (GAUZE/BANDAGES/DRESSINGS) ×1 IMPLANT
DRAPE C-ARM 42X120 X-RAY (DRAPES) IMPLANT
DRAPE SHEET LG 3/4 BI-LAMINATE (DRAPES) IMPLANT
ELECT L-HOOK LAP 45CM DISP (ELECTROSURGICAL)
ELECT PENCIL ROCKER SW 15FT (MISCELLANEOUS) ×2 IMPLANT
ELECT REM PT RETURN 15FT ADLT (MISCELLANEOUS) ×2 IMPLANT
ELECTRODE L-HOOK LAP 45CM DISP (ELECTROSURGICAL) IMPLANT
ENDOLOOP SUT PDS II  0 18 (SUTURE) ×2
ENDOLOOP SUT PDS II 0 18 (SUTURE) IMPLANT
GLOVE SURG POLYISO LF SZ5.5 (GLOVE) ×2 IMPLANT
GLOVE SURG UNDER POLY LF SZ6 (GLOVE) ×2 IMPLANT
GOWN STRL REUS W/TWL LRG LVL3 (GOWN DISPOSABLE) ×2 IMPLANT
GOWN STRL REUS W/TWL XL LVL3 (GOWN DISPOSABLE) ×4 IMPLANT
GRASPER SUT TROCAR 14GX15 (MISCELLANEOUS) IMPLANT
HEMOSTAT SNOW SURGICEL 2X4 (HEMOSTASIS) IMPLANT
IRRIG SUCT STRYKERFLOW 2 WTIP (MISCELLANEOUS) ×2
IRRIGATION SUCT STRKRFLW 2 WTP (MISCELLANEOUS) ×1 IMPLANT
KIT BASIN OR (CUSTOM PROCEDURE TRAY) ×2 IMPLANT
KIT TURNOVER KIT A (KITS) ×1 IMPLANT
L-HOOK LAP DISP 36CM (ELECTROSURGICAL) ×2
LHOOK LAP DISP 36CM (ELECTROSURGICAL) ×1 IMPLANT
NDL INSUFFLATION 14GA 120MM (NEEDLE) IMPLANT
NEEDLE INSUFFLATION 14GA 120MM (NEEDLE) IMPLANT
POUCH SPECIMEN RETRIEVAL 10MM (ENDOMECHANICALS) ×2 IMPLANT
SCISSORS LAP 5X35 DISP (ENDOMECHANICALS) ×2 IMPLANT
SET CHOLANGIOGRAPH MIX (MISCELLANEOUS) IMPLANT
SET TUBE SMOKE EVAC HIGH FLOW (TUBING) ×2 IMPLANT
SLEEVE XCEL OPT CAN 5 100 (ENDOMECHANICALS) ×4 IMPLANT
SUT MNCRL AB 4-0 PS2 18 (SUTURE) ×2 IMPLANT
TOWEL OR 17X26 10 PK STRL BLUE (TOWEL DISPOSABLE) ×2 IMPLANT
TOWEL OR NON WOVEN STRL DISP B (DISPOSABLE) IMPLANT
TRAY LAPAROSCOPIC (CUSTOM PROCEDURE TRAY) ×2 IMPLANT
TROCAR BLADELESS OPT 5 100 (ENDOMECHANICALS) ×2 IMPLANT
TROCAR XCEL 12X100 BLDLESS (ENDOMECHANICALS) IMPLANT
TROCAR XCEL BLUNT TIP 100MML (ENDOMECHANICALS) IMPLANT

## 2021-01-17 NOTE — ED Provider Notes (Signed)
Patient transferred from [facility] for general surgery evaluation.  Imaging significant for cholecystitis, diverticulitis.  She has received antibiotics prior to ED transfer.  On examination she does have focal right upper quadrant tenderness, declines pain meds at this time.  General surgery consulted for further management.   Quintella Reichert, MD 01/17/21 0830

## 2021-01-17 NOTE — Anesthesia Procedure Notes (Signed)
Procedure Name: Intubation Date/Time: 01/17/2021 12:04 PM Performed by: Sharlette Dense, CRNA Pre-anesthesia Checklist: Patient identified, Emergency Drugs available, Suction available and Patient being monitored Patient Re-evaluated:Patient Re-evaluated prior to induction Oxygen Delivery Method: Circle system utilized Preoxygenation: Pre-oxygenation with 100% oxygen Induction Type: IV induction, Rapid sequence and Cricoid Pressure applied Ventilation: Mask ventilation without difficulty Laryngoscope Size: Miller and 2 Grade View: Grade I Tube type: Oral Tube size: 7.5 mm Number of attempts: 1 Airway Equipment and Method: Stylet and Oral airway Placement Confirmation: ETT inserted through vocal cords under direct vision, positive ETCO2 and breath sounds checked- equal and bilateral Secured at: 22 cm Tube secured with: Tape Dental Injury: Teeth and Oropharynx as per pre-operative assessment

## 2021-01-17 NOTE — H&P (Addendum)
Jaime Thomas 01-Nov-1955  [id number].    Requesting MD: Ralene Bathe, MD Chief Complaint/Reason for Consult: abdominal pain, cholelithiasis   HPI:  Jaime Thomas is a 66 y/o F with a PMH HTN, HLD, asthma, and diverticulitis who presented to [facility] with a cc abdominal pain. She states that yesterday she ate a bacon cheeseburger for breakfast and then in the later afternoon she started having back pain. She thought it was gas so she took some gas medication with no relief. After this she reports significant nausea and non-bilious vomiting along with sharp, constant, upper abdominal pain. She denies alleviating factors. Denies similar pain in the past. Denies associated fever, chills, diarrhea, or constipation. Denies recent travel, new foods, or sick contacts. States she was diagnosed with diverticulitis and constipation at an urgent care 2-3 months ago and the pain she has now is very different from the lower abdominal pain she experienced with diverticulitis. She states she has never had a colonoscopy. Denies a known personal or family history of colon cancer.   Social Hx: smokes 5 cigarettes daily. Denies alcohol or other drug use. Currently employed at a desk job.  Surgical Hx: Hysterectomy, umbilical hernia repair with mesh over 20 years ago in [place].   ROS: Review of Systems  All other systems reviewed and are negative.  No family history on file.  History reviewed. No pertinent past medical history.  Past Surgical History:  Procedure Laterality Date   ABDOMINAL HYSTERECTOMY      Social History:  reports that she has never smoked. She has never used smokeless tobacco. She reports that she does not drink alcohol and does not use drugs.  Allergies: No Known Allergies  (Not in a hospital admission)  Physical Exam: Blood pressure (!) 158/94, pulse 89, temperature 98.4 F (36.9 C), resp. rate 15, height 5\' 9"  (1.753 m), weight 94.8 kg, SpO2 95 %. General: Pleasant  female laying on hospital bed, appears stated age, NAD. HEENT: head -normocephalic, atraumatic; Eyes: PERRLA, no conjunctival injection; Ears- no external lesions or tenderness, Throat: pink mucosa, uvula midline, no exudates.  Neck- Trachea is midline, no thyromegaly CV- RRR, normal S1/S2, no M/R/G, radial and dorsalis pedis pulses 2+ BL Pulm- breathing is non-labored ORA. CTABL, no wheezes, rhales, rhonchi. Abd- soft, non-distended, TTP epigastrium and RUQ with voluntary guarding. No peritonitis. no masses, hernias, or organomegaly. GU- deferred  MSK- UE/LE symmetrical, no cyanosis, clubbing, or edema. Neuro- CN II-XII grossly in tact, no paresthesias. Psych- Alert and Oriented x3 with appropriate affect Skin: warm and dry, no rashes or lesions   Results for orders placed or performed during the hospital encounter of 01/16/21 (from the past 48 hour(s))  Lipase, blood     Status: None   Collection Time: 01/16/21 10:13 PM  Result Value Ref Range   Lipase 34 11 - 51 U/L    Comment: Performed at [facility], [address] [place], [place] [address]  Comprehensive metabolic panel     Status: Abnormal   Collection Time: 01/16/21 10:13 PM  Result Value Ref Range   Sodium 137 135 - 145 mmol/L   Potassium 3.2 (L) 3.5 - 5.1 mmol/L   Chloride 102 98 - 111 mmol/L   CO2 21 (L) 22 - 32 mmol/L   Glucose, Bld 144 (H) 70 - 99 mg/dL    Comment: Glucose reference range applies only to samples taken after fasting for at least 8 hours.   BUN 12  8 - 23 mg/dL   Creatinine, Ser 0.79 0.44 - 1.00 mg/dL   Calcium 9.6 8.9 - 10.3 mg/dL   Total Protein 7.9 6.5 - 8.1 g/dL   Albumin 4.1 3.5 - 5.0 g/dL   AST 17 15 - 41 U/L   ALT 14 0 - 44 U/L   Alkaline Phosphatase 87 38 - 126 U/L   Total Bilirubin 0.3 0.3 - 1.2 mg/dL   GFR, Estimated >60 >60 mL/min    Comment: (NOTE) Calculated using the CKD-EPI Creatinine Equation (2021)    Anion gap 14 5 - 15    Comment: Performed at [facility], [address] [place], [place] [address]  CBC     Status: Abnormal   Collection Time: 01/16/21 10:13 PM  Result Value Ref Range   WBC 10.2 4.0 - 10.5 K/uL   RBC 5.28 (H) 3.87 - 5.11 MIL/uL   Hemoglobin 14.0 12.0 - 15.0 g/dL   HCT 41.4 36.0 - 46.0 %   MCV 78.4 (L) 80.0 - 100.0 fL   MCH 26.5 26.0 - 34.0 pg   MCHC 33.8 30.0 - 36.0 g/dL   RDW 13.6 11.5 - 15.5 %   Platelets 344 150 - 400 K/uL   nRBC 0.0 0.0 - 0.2 %    Comment: Performed at [facility], [address] [place], [place] [address]  Urinalysis, Routine w reflex microscopic Urine, Clean Catch     Status: Abnormal   Collection Time: 01/17/21  3:22 AM  Result Value Ref Range   Color, Urine YELLOW YELLOW   APPearance CLOUDY (A) CLEAR   Specific Gravity, Urine 1.020 1.005 - 1.030   pH 8.5 (H) 5.0 - 8.0   Glucose, UA NEGATIVE NEGATIVE mg/dL   Hgb urine dipstick NEGATIVE NEGATIVE   Bilirubin Urine NEGATIVE NEGATIVE   Ketones, ur NEGATIVE NEGATIVE mg/dL   Protein, ur NEGATIVE NEGATIVE mg/dL   Nitrite NEGATIVE NEGATIVE   Leukocytes,Ua SMALL (A) NEGATIVE    Comment: Performed at [facility], [address] [place], [place] [address]  Urinalysis, Microscopic (reflex)     Status: Abnormal   Collection Time: 01/17/21  3:22 AM  Result Value Ref Range   RBC / HPF 0-5 0 - 5 RBC/hpf   WBC, UA 6-10 0 - 5 WBC/hpf   Bacteria, UA MANY (A) NONE SEEN   Squamous Epithelial / LPF 0-5 0 - 5   Amorphous Crystal PRESENT     Comment: Performed at [facility], [address] [place], [place] [address]  Resp Panel by RT-PCR (Flu A&B, Covid) Nasopharyngeal Swab     Status: None   Collection Time: 01/17/21  5:30 AM   Specimen: Nasopharyngeal Swab; Nasopharyngeal(NP) swabs in vial transport medium  Result Value Ref Range   SARS Coronavirus 2 by RT PCR NEGATIVE NEGATIVE    Comment: (NOTE) SARS-CoV-2 target nucleic acids are NOT DETECTED.  The SARS-CoV-2 RNA is generally detectable in upper  respiratory specimens during the acute phase of infection. The lowest concentration of SARS-CoV-2 viral copies this assay can detect is 138 copies/mL. A negative result does not preclude SARS-Cov-2 infection and should not be used as the sole basis for treatment or other patient management decisions. A negative result may occur with  improper specimen collection/handling, submission of specimen other than nasopharyngeal swab, presence of viral mutation(s) within the areas targeted by this assay, and inadequate number of viral copies(<138 copies/mL). A negative result must  be combined with clinical observations, patient history, and epidemiological information. The expected result is Negative.  Fact Sheet for Patients:  EntrepreneurPulse.com.au  Fact Sheet for Healthcare Providers:  IncredibleEmployment.be  This test is no t yet approved or cleared by the [place] FDA and  has been authorized for detection and/or diagnosis of SARS-CoV-2 by FDA under an Emergency Use Authorization (EUA). This EUA will remain  in effect (meaning this test can be used) for the duration of the COVID-19 declaration under Section 564(b)(1) of the Act, 21 U.S.C.section 360bbb-3(b)(1), unless the authorization is terminated  or revoked sooner.       Influenza A by PCR NEGATIVE NEGATIVE   Influenza B by PCR NEGATIVE NEGATIVE    Comment: (NOTE) The Xpert Xpress SARS-CoV-2/FLU/RSV plus assay is intended as an aid in the diagnosis of influenza from Nasopharyngeal swab specimens and should not be used as a sole basis for treatment. Nasal washings and aspirates are unacceptable for Xpert Xpress SARS-CoV-2/FLU/RSV testing.  Fact Sheet for Patients: EntrepreneurPulse.com.au  Fact Sheet for Healthcare Providers: IncredibleEmployment.be  This test is not yet approved or cleared by the [place] FDA and has been authorized for  detection and/or diagnosis of SARS-CoV-2 by FDA under an Emergency Use Authorization (EUA). This EUA will remain in effect (meaning this test can be used) for the duration of the COVID-19 declaration under Section 564(b)(1) of the Act, 21 U.S.C. section 360bbb-3(b)(1), unless the authorization is terminated or revoked.  Performed at [facility], [address] [place], [place] [address]    CT ABDOMEN PELVIS W CONTRAST  Result Date: 01/17/2021 CLINICAL DATA:  66 year old female with abdominal and low back pain with vomiting. EXAM: CT ABDOMEN AND PELVIS WITH CONTRAST TECHNIQUE: Multidetector CT imaging of the abdomen and pelvis was performed using the standard protocol following bolus administration of intravenous contrast. CONTRAST:  113mL OMNIPAQUE IOHEXOL 300 MG/ML  SOLN COMPARISON:  Portable chest 10/26/2018. FINDINGS: Lower chest: Cardiac size at the upper limits of normal. No pericardial effusion. Mosaic attenuation in both lower lungs, favored to be chronic with some evidence of underlying chronic subpleural scarring. No pleural effusion. Hepatobiliary: Extensive lipid laden gallstones throughout the gallbladder, and appear especially concentrated at porta hepatis on coronal image 59. Individual stone size estimated at 12 mm. The gallbladder Hesketh appears indistinct on series 2, image 35 and coronal image 70. There is mild hepatic periportal edema or intrahepatic mild ductal enlargement. The CBD is at the upper limits of normal. Liver enhancement maintained. Pancreas: Negative. Spleen: Negative. Adrenals/Urinary Tract: Negative. No nephrolithiasis or obstructive uropathy. Bladder remains within normal limits. Stomach/Bowel: Mostly decompressed rectum does contain a small volume of fluid. Abnormal sigmoid colon in the central pelvis and along the left pelvic side Heinbaugh with moderate diverticulosis, indistinct sigmoid Dudash thickening. Mesenteric inflammation inseparable from the left adnexa  on coronal image 34. No extraluminal gas or discrete fluid. Upstream the splenic flexure and descending colon also appear mildly thickened and indistinct but there is no associated mesenteric inflammation. Diverticulosis continues in those segments. Transverse and right colon have a more normal appearance with retained stool. Normal appendix on coronal image 52. Negative terminal ileum. No dilated small bowel. Stomach and duodenum are within normal limits. No free air or free fluid. Vascular/Lymphatic: Mild Aortoiliac calcified atherosclerosis. Major arterial structures are patent. No lymphadenopathy. Portal venous system is patent. Reproductive: Surgically absent uterus. The left adnexa is inseparable from the abnormal sigmoid colon and appears secondarily inflamed on coronal image 34,  with left pelvic side Milson soft tissue thickening and stranding on series 2, image 73. Right adnexa within normal limits. Other: No pelvic free fluid. Musculoskeletal: Lower lumbar facet degeneration. No acute or suspicious osseous lesion. IMPRESSION: 1. Abnormal descending and sigmoid colon compatible with Acute Diverticulitis/Colitis. Inflammation maximal in the mid sigmoid which appears adhered to the left adnexa which is secondarily inflamed along the pelvic side Mayer. But no associated perforation, abscess, or other complicating features. 2. But also abnormal Liver and Gallbladder. Numerous lipid laden gallstones, with possible impacted stones at the gallbladder neck. Mild gallbladder inflammation suspected and early biliary ductal dilatation. Follow-up Right Upper Quadrant Ultrasound may be valuable. 3. Chronic lung disease suspected at the lung bases. Aortic Atherosclerosis (ICD10-I70.0). Electronically Signed   By: Genevie Ann M.D.   On: 01/17/2021 04:40      Assessment/Plan Symptomatic cholelithiasis, suspect acute calculous cholecystitis  Patient with acute onset back, epigastric, and RUQ pain after eating a cheeseburger.  Associated with nausea and vomiting. Pain temporarily relieved by pain medication but when the medicine wears off her pain persists. She has multiple gallstones and mild Reetz thickening on CT. WBC and LFTs were WNL yesterday. High suspicion for acute cholecystitis. May have chronic cholecystitis as well. Recommend NPO, IV abx, and laparoscopic cholecystectomy with possible IOC today by Dr. Zenia Resides. Will discuss timing of surgery with Dr. Zenia Resides.  The risks of surgery including bleeding, infection, conversion to open, damage to surrounding structures, drain placement, need for additional surgeries/procedures, as well as the cardiac and pulmonary risks of general anesthesia were discussed with the patient and she would like to proceed with surgery. I welcomed the patients questions and answered them. Her daughter, Lavella Lemons, is her contact person [phone].  Uncomplicated diverticulitis- clinically she is asymptomatic. She is afebrile, no leukocytosis, no lower abdominal pain or significant changes in bowel habits. Her CT scan shows  Vandervoort thickening of the descending and sigmoid colon without fat stranding or mesenteric edema. Recommend IV abx for now. outpatient referral to GI for colonoscopy.    Asthma - PRN albuterol nebs HTN - PRN hydralazine  HLD Tobacco use  FEN - NPO, IVF VTE - SCD's, hold chemical VTE for surgery  ID - Rocephin/Flagyl 1/5 >>  Admit - to [facility] service for cholecystectomy and observation   Moderate Medical Decision Making  Jill Alexanders, [facility] 01/17/2021, 8:32 AM Please see Amion for pager number during day hours 7:00am-4:30pm or 7:00am -11:30am on weekends

## 2021-01-17 NOTE — ED Notes (Signed)
Pt given toothbrush and CHG clothes to prepare for surgery.

## 2021-01-17 NOTE — Op Note (Signed)
Date: 01/17/21  Patient: Jaime Thomas MRN: [id number]  Preoperative Diagnosis: Acute calculous cholecystitis Postoperative Diagnosis: Same  Procedure: Laparoscopic cholecystectomy  Surgeon: Michaelle Birks, MD Assistant: Richard Miu, PA-C  EBL: 50 mL  Anesthesia: General endotracheal  Specimens: Gallbladder  Indications: Jaime Thomas is a 66 year old female who presented with acute onset right upper quadrant abdominal pain associated with nausea and vomiting.  Her symptoms began yesterday and have increasingly worsened.  A CT scan showed gallbladder Loewen thickening and mild pericholecystic fluid, with numerous gallstones.  After discussion of the risks and benefits of surgery, cholecystectomy was recommended and she agreed to proceed.  Findings: Significant gallbladder Fotheringham edema consistent with acute cholecystitis.  Procedure details: Informed consent was obtained in the preoperative area prior to the procedure. The patient was brought to the operating room and placed on the table in the supine position. General anesthesia was induced and appropriate lines and drains were placed for intraoperative monitoring. Perioperative antibiotics were administered per SCIP guidelines. The abdomen was prepped and draped in the usual sterile fashion. A pre-procedure timeout was taken verifying patient identity, surgical site and procedure to be performed.  The patient reported a prior history of umbilical hernia repair with mesh placement, thus the decision was made to enter the abdomen at [place].  A small incision was made in the left upper quadrant and a Veress needle was inserted through the fascia.  Intraperitoneal placement was confirmed with the saline drop test and the abdomen was insufflated.  A 5 mm port was placed and the abdomen was inspected with no evidence of visceral or vascular injury.  The umbilical area was visualized and there was no visible mesh.  An infraumbilical skin incision was  made, the subcutaneous tissue was divided with cautery, and an 72mm trocar was placed under direct visualization.  The scope was moved to the umbilical port, and two 46mm ports were placed in the right subcostal margin under direct visualization.  The gallbladder was very distended and difficult to grasp and so it was needle decompressed to aid in retraction.  The Garver was thickened and edematous, consistent with acute cholecystitis.  The fundus of the gallbladder was grasped and retracted cephalad. The infundibulum was retracted laterally. The cystic triangle was dissected out using cautery and blunt dissection, and the critical view of safety was obtained. The cystic cystic artery was clipped and ligated.  The posterior branch of the cystic artery was inadvertently divided, and hemostasis was achieved with clip placement.  The cystic duct was dilated and there was inflammatory tissue in the cystic triangle that appeared to be tenting the common bile duct up toward the gallbladder.  However there was a clear critical view of safety and the decision was made to divide the cystic duct very close to the gallbladder.  There were some stones lodged in the neck of the gallbladder at the juncture with the cystic duct, and these were milked up into the gallbladder.  The cystic duct was sharply transected and the cystic duct stump was closed with a PDS Endoloop.  The gallbladder was taken off the liver using cautery. The specimen was placed in an endocatch bag and removed. The surgical site was irrigated with saline until the effluent was clear. Hemostasis was achieved in the gallbladder fossa using cautery. The cystic duct and artery stumps were visually inspected and there was no evidence of bile leak or bleeding. The ports were removed under direct visualization and the abdomen was desufflated. The umbilical port  site fascia was closed with a 0 vicryl suture. The skin at all port sites was closed with 4-0 monocryl  subcuticular suture. Dermabond was applied.  The patient tolerated the procedure well with no apparent complications.  All counts were correct x2 at the end of the procedure. The patient was extubated and taken to PACU in stable condition.  Michaelle Birks, MD 01/17/21 3:39 PM

## 2021-01-17 NOTE — ED Provider Notes (Signed)
[facility] Provider Note  CSN: [id number] Arrival date & time: 01/16/21 2138  Chief Complaint(s) Abdominal Pain  HPI Jaime Thomas is a 66 y.o. female with a past medical history of hypertension, hyperlipidemia who was recently diagnosed with diverticulitis 2 months ago.  She presents today for several hours of gradually worsening upper abdominal pain with associated nausea and nonbloody nonbilious emesis.  No known suspicious food intake.  No associated diarrhea.  No urinary symptoms.  Pain has significantly worsened throughout the time worse with emesis and palpation of the upper abdomen.  No chest pain or shortness of breath.   Abdominal Pain  Past Medical History History reviewed. No pertinent past medical history. There are no problems to display for this patient.  Home Medication(s) Prior to Admission medications   Medication Sig Start Date End Date Taking? Authorizing Provider  benzonatate (TESSALON) 100 MG capsule Take 1 capsule (100 mg total) by mouth every 8 (eight) hours. 10/26/18   Deno Etienne, DO                                                                                                                                    Allergies Patient has no known allergies.  Review of Systems Review of Systems  Gastrointestinal:  Positive for abdominal pain.  As noted in HPI  Physical Exam Vital Signs  I have reviewed the triage vital signs BP (!) 143/82    Pulse 93    Temp 98.3 F (36.8 C) (Oral)    Resp (!) 23    Ht 5\' 9"  (1.753 m)    Wt 94.8 kg    SpO2 97%    BMI 30.86 kg/m   Physical Exam Vitals reviewed.  Constitutional:      General: She is not in acute distress.    Appearance: She is well-developed. She is not diaphoretic.  HENT:     Head: Normocephalic and atraumatic.     Right Ear: External ear normal.     Left Ear: External ear normal.     Nose: Nose normal.  Eyes:     General: No scleral icterus.    Conjunctiva/sclera:  Conjunctivae normal.  Neck:     Trachea: Phonation normal.  Cardiovascular:     Rate and Rhythm: Normal rate and regular rhythm.  Pulmonary:     Effort: Pulmonary effort is normal. No respiratory distress.     Breath sounds: No stridor.  Abdominal:     General: There is no distension.     Tenderness: There is abdominal tenderness in the right upper quadrant, epigastric area and left upper quadrant. There is guarding. There is no rebound.  Musculoskeletal:        General: Normal range of motion.     Cervical back: Normal range of motion.  Neurological:     Mental Status: She is alert and oriented to person, place, and time.  Psychiatric:  Behavior: Behavior normal.    ED Results and Treatments Labs (all labs ordered are listed, but only abnormal results are displayed) Labs Reviewed  COMPREHENSIVE METABOLIC PANEL - Abnormal; Notable for the following components:      Result Value   Potassium 3.2 (*)    CO2 21 (*)    Glucose, Bld 144 (*)    All other components within normal limits  CBC - Abnormal; Notable for the following components:   RBC 5.28 (*)    MCV 78.4 (*)    All other components within normal limits  URINALYSIS, ROUTINE W REFLEX MICROSCOPIC - Abnormal; Notable for the following components:   APPearance CLOUDY (*)    pH 8.5 (*)    Leukocytes,Ua SMALL (*)    All other components within normal limits  URINALYSIS, MICROSCOPIC (REFLEX) - Abnormal; Notable for the following components:   Bacteria, UA MANY (*)    All other components within normal limits  RESP PANEL BY RT-PCR (FLU A&B, COVID) ARPGX2  LIPASE, BLOOD                                                                                                                         EKG  EKG Interpretation  Date/Time:    Ventricular Rate:    PR Interval:    QRS Duration:   QT Interval:    QTC Calculation:   R Axis:     Text Interpretation:         Radiology CT ABDOMEN PELVIS W CONTRAST  Result  Date: 01/17/2021 CLINICAL DATA:  66 year old female with abdominal and low back pain with vomiting. EXAM: CT ABDOMEN AND PELVIS WITH CONTRAST TECHNIQUE: Multidetector CT imaging of the abdomen and pelvis was performed using the standard protocol following bolus administration of intravenous contrast. CONTRAST:  125mL OMNIPAQUE IOHEXOL 300 MG/ML  SOLN COMPARISON:  Portable chest 10/26/2018. FINDINGS: Lower chest: Cardiac size at the upper limits of normal. No pericardial effusion. Mosaic attenuation in both lower lungs, favored to be chronic with some evidence of underlying chronic subpleural scarring. No pleural effusion. Hepatobiliary: Extensive lipid laden gallstones throughout the gallbladder, and appear especially concentrated at porta hepatis on coronal image 59. Individual stone size estimated at 12 mm. The gallbladder Illes appears indistinct on series 2, image 35 and coronal image 70. There is mild hepatic periportal edema or intrahepatic mild ductal enlargement. The CBD is at the upper limits of normal. Liver enhancement maintained. Pancreas: Negative. Spleen: Negative. Adrenals/Urinary Tract: Negative. No nephrolithiasis or obstructive uropathy. Bladder remains within normal limits. Stomach/Bowel: Mostly decompressed rectum does contain a small volume of fluid. Abnormal sigmoid colon in the central pelvis and along the left pelvic side C[place]allos with moderate diverticulosis, indistinct sigmoid Wasilewski thickening. Mesenteric inflammation inseparable from the left adnexa on coronal image 34. No extraluminal gas or discrete fluid. Upstream the splenic flexure and descending colon also appear mildly thickened and indistinct but there is no associated mesenteric inflammation. Diverticulosis continues in those segments. Transverse and  right colon have a more normal appearance with retained stool. Normal appendix on coronal image 52. Negative terminal ileum. No dilated small bowel. Stomach and duodenum are within normal  limits. No free air or free fluid. Vascular/Lymphatic: Mild Aortoiliac calcified atherosclerosis. Major arterial structures are patent. No lymphadenopathy. Portal venous system is patent. Reproductive: Surgically absent uterus. The left adnexa is inseparable from the abnormal sigmoid colon and appears secondarily inflamed on coronal image 34, with left pelvic side [place] soft tissue thickening and stranding on series 2, image 73. Right adnexa within normal limits. Other: No pelvic free fluid. Musculoskeletal: Lower lumbar facet degeneration. No acute or suspicious osseous lesion. IMPRESSION: 1. Abnormal descending and sigmoid colon compatible with Acute Diverticulitis/Colitis. Inflammation maximal in the mid sigmoid which appears adhered to the left adnexa which is secondarily inflamed along the pelvic side Montesano. But no associated perforation, abscess, or other complicating features. 2. But also abnormal Liver and Gallbladder. Numerous lipid laden gallstones, with possible impacted stones at the gallbladder neck. Mild gallbladder inflammation suspected and early biliary ductal dilatation. Follow-up Right Upper Quadrant Ultrasound may be valuable. 3. Chronic lung disease suspected at the lung bases. Aortic Atherosclerosis (ICD10-I70.0). Electronically Signed   By: Genevie Ann M.D.   On: 01/17/2021 04:40    Pertinent labs & imaging results that were available during my care of the patient were reviewed by me and considered in my medical decision making (see MDM for details).  Medications Ordered in ED Medications  cefTRIAXone (ROCEPHIN) 2 g in sodium chloride 0.9 % 100 mL IVPB (2 g Intravenous New Bag/Given 01/17/21 0528)    And  metroNIDAZOLE (FLAGYL) IVPB 500 mg (has no administration in time range)  sodium chloride 0.9 % bolus 1,000 mL (1,000 mLs Intravenous New Bag/Given 01/17/21 0526)    Followed by  0.9 %  sodium chloride infusion (has no administration in time range)  ondansetron (ZOFRAN-ODT) disintegrating  tablet 4 mg (4 mg Oral Given 01/16/21 2216)  fentaNYL (SUBLIMAZE) injection 75 mcg (75 mcg Intravenous Given 01/17/21 0030)  sodium chloride 0.9 % bolus 1,000 mL (0 mLs Intravenous Stopped 01/17/21 0307)  ondansetron (ZOFRAN) injection 4 mg (4 mg Intravenous Given 01/17/21 0154)  fentaNYL (SUBLIMAZE) injection 75 mcg (75 mcg Intravenous Given 01/17/21 0349)  iohexol (OMNIPAQUE) 300 MG/ML solution 100 mL (100 mLs Intravenous Contrast Given 01/17/21 0406)                                                                                                                                     Procedures .1-3 Lead EKG Interpretation Performed by: Fatima Blank, MD Authorized by: Fatima Blank, MD     Interpretation: normal     ECG rate:  93   ECG rate assessment: normal     Rhythm: sinus rhythm     Ectopy: none     Conduction: normal    (including critical care time)  Medical Decision  Making / ED Course     Upper abdominal pain with tenderness.  Will assess for biliary disease or pancreatitis.  Patient with known history of diverticulitis.  We will also assess for this though less likely given location of her pain.    Patient provided with IV fluids, antiemetics and pain medicine.  Labs independently interpreted by me, noted below: CBC without leukocytosis or significant anemia. No significant electrolyte derangements or renal sufficiency. No evidence of bili obstruction or pancreatitis.  After initial round of medication patient's pain had significantly improved.  Now more locally tender in the right upper quadrant and epigastrium.  Patient had additional bouts of emesis.  Given additional pain medicine IV fluids.  CT scan obtained and independently interpreted by me notable for gallstones with mild gallbladder Goeser thickening.  This was confirmed by radiology who also noted stone embedded in the neck of the gallbladder.  They also noted evidence of recurrent diverticulitis  without perforation or abscess in the descending and sigmoid colon.   UA also obtain noted to be cloudy that showed bacteria.   Patient started on empiric Rocephin and Flagyl.  I consulted general surgery and spoke with Dr. Ninfa Linden.  He requested patient be sent to [facility] emergency department for further evaluation and management.  I spoke with Dr. Ayesha Rumpf, EDP who accepted the patient.    Final Clinical Impression(s) / ED Diagnoses Final diagnoses:  Gallstone (impacted)  Diverticulitis           This chart was dictated using voice recognition software.  Despite best efforts to proofread,  errors can occur which can change the documentation meaning.    Fatima Blank, MD 01/17/21 [phone]

## 2021-01-17 NOTE — Anesthesia Preprocedure Evaluation (Addendum)
Anesthesia Evaluation  Patient identified by MRN, date of birth, ID band Patient awake    Reviewed: Allergy & Precautions, NPO status , Patient's Chart, lab work & pertinent test results  History of Anesthesia Complications Negative for: history of anesthetic complications  Airway Mallampati: I  TM Distance: >3 FB Neck ROM: Full    Dental  (+) Edentulous Upper, Edentulous Lower   Pulmonary COPD,  COPD inhaler, Patient abstained from smoking.,    breath sounds clear to auscultation       Cardiovascular hypertension, Pt. on medications (-) angina Rhythm:Regular Rate:Normal     Neuro/Psych negative neurological ROS     GI/Hepatic Neg liver ROS, N/v with cholecystitis   Endo/Other  obese  Renal/GU negative Renal ROS     Musculoskeletal   Abdominal (+) + obese,   Peds  Hematology negative hematology ROS (+)   Anesthesia Other Findings   Reproductive/Obstetrics                            Anesthesia Physical Anesthesia Plan  ASA: 2  Anesthesia Plan: General   Post-op Pain Management: Tylenol PO (pre-op)   Induction: Intravenous and Rapid sequence  PONV Risk Score and Plan: 3 and Ondansetron, Dexamethasone, Treatment may vary due to age or medical condition and Scopolamine patch - Pre-op  Airway Management Planned: Oral ETT  Additional Equipment: None  Intra-op Plan:   Post-operative Plan: Extubation in OR  Informed Consent: I have reviewed the patients History and Physical, chart, labs and discussed the procedure including the risks, benefits and alternatives for the proposed anesthesia with the patient or authorized representative who has indicated his/her understanding and acceptance.     Dental advisory given  Plan Discussed with: CRNA and Surgeon  Anesthesia Plan Comments:        Anesthesia Quick Evaluation

## 2021-01-17 NOTE — Transfer of Care (Signed)
Immediate Anesthesia Transfer of Care Note  Patient: Jaime Thomas  Procedure(s) Performed: LAPAROSCOPIC CHOLECYSTECTOMY (Abdomen)  Patient Location: PACU  Anesthesia Type:General  Level of Consciousness: drowsy  Airway & Oxygen Therapy: Patient Spontanous Breathing and Patient connected to face mask oxygen  Post-op Assessment: Report given to RN and Post -op Vital signs reviewed and stable  Post vital signs: Reviewed and stable  Last Vitals:  Vitals Value Taken Time  BP 147/74 01/17/21 1323  Temp    Pulse 95 01/17/21 1324  Resp    SpO2 100 % 01/17/21 1324  Vitals shown include unvalidated device data.  Last Pain:  Vitals:   01/17/21 1035  TempSrc: Oral  PainSc: 0-No pain         Complications: No notable events documented.

## 2021-01-18 ENCOUNTER — Encounter (HOSPITAL_COMMUNITY): Payer: Self-pay | Admitting: Surgery

## 2021-01-18 LAB — SURGICAL PATHOLOGY

## 2021-01-18 MED ORDER — ACETAMINOPHEN 500 MG PO TABS: 1000.0000 mg | ORAL_TABLET | Freq: Four times a day (QID) | ORAL | 0 refills | Status: AC | PRN

## 2021-01-18 MED ORDER — OXYCODONE HCL 5 MG PO TABS: 5.0000 mg | ORAL_TABLET | Freq: Four times a day (QID) | ORAL | 0 refills | Status: DC | PRN

## 2021-01-18 NOTE — Discharge Summary (Addendum)
[facility] Discharge Summary   Patient ID: Jaime Thomas MRN: [id number] DOB/AGE: 1955-02-13 66 y.o.  Admit date: 01/16/2021 Discharge date: 01/18/2021  Admitting Diagnosis: Calculous cholecystitis   Discharge Diagnosis Patient Active Problem List   Diagnosis Date Noted   Cholecystitis with cholelithiasis 01/17/2021   Consultants None   Imaging: CT ABDOMEN PELVIS W CONTRAST  Result Date: 01/17/2021 CLINICAL DATA:  66 year old female with abdominal and low back pain with vomiting. EXAM: CT ABDOMEN AND PELVIS WITH CONTRAST TECHNIQUE: Multidetector CT imaging of the abdomen and pelvis was performed using the standard protocol following bolus administration of intravenous contrast. CONTRAST:  150mL OMNIPAQUE IOHEXOL 300 MG/ML  SOLN COMPARISON:  Portable chest 10/26/2018. FINDINGS: Lower chest: Cardiac size at the upper limits of normal. No pericardial effusion. Mosaic attenuation in both lower lungs, favored to be chronic with some evidence of underlying chronic subpleural scarring. No pleural effusion. Hepatobiliary: Extensive lipid laden gallstones throughout the gallbladder, and appear especially concentrated at porta hepatis on coronal image 59. Individual stone size estimated at 12 mm. The gallbladder Arp appears indistinct on series 2, image 35 and coronal image 70. There is mild hepatic periportal edema or intrahepatic mild ductal enlargement. The CBD is at the upper limits of normal. Liver enhancement maintained. Pancreas: Negative. Spleen: Negative. Adrenals/Urinary Tract: Negative. No nephrolithiasis or obstructive uropathy. Bladder remains within normal limits. Stomach/Bowel: Mostly decompressed rectum does contain a small volume of fluid. Abnormal sigmoid colon in the central pelvis and along the left pelvic side Florentino with moderate diverticulosis, indistinct sigmoid Bazzi thickening. Mesenteric inflammation inseparable from the left adnexa on coronal image 34. No extraluminal  gas or discrete fluid. Upstream the splenic flexure and descending colon also appear mildly thickened and indistinct but there is no associated mesenteric inflammation. Diverticulosis continues in those segments. Transverse and right colon have a more normal appearance with retained stool. Normal appendix on coronal image 52. Negative terminal ileum. No dilated small bowel. Stomach and duodenum are within normal limits. No free air or free fluid. Vascular/Lymphatic: Mild Aortoiliac calcified atherosclerosis. Major arterial structures are patent. No lymphadenopathy. Portal venous system is patent. Reproductive: Surgically absent uterus. The left adnexa is inseparable from the abnormal sigmoid colon and appears secondarily inflamed on coronal image 34, with left pelvic side Tuckerman soft tissue thickening and stranding on series 2, image 73. Right adnexa within normal limits. Other: No pelvic free fluid. Musculoskeletal: Lower lumbar facet degeneration. No acute or suspicious osseous lesion. IMPRESSION: 1. Abnormal descending and sigmoid colon compatible with Acute Diverticulitis/Colitis. Inflammation maximal in the mid sigmoid which appears adhered to the left adnexa which is secondarily inflamed along the pelvic side [place]. But no associated perforation, abscess, or other complicating features. 2. But also abnormal Liver and Gallbladder. Numerous lipid laden gallstones, with possible impacted stones at the gallbladder neck. Mild gallbladder inflammation suspected and early biliary ductal dilatation. Follow-up Right Upper Quadrant Ultrasound may be valuable. 3. Chronic lung disease suspected at the lung bases. Aortic Atherosclerosis (ICD10-I70.0). Electronically Signed   By: Genevie Ann M.D.   On: 01/17/2021 04:40    Procedures Dr. Michaelle Birks - laparoscopic cholecystectomy 01/17/21  Hospital Course:  66 year old female who presented to [facility] with a chief complaint of acute onset abdominal pain associated  with nausea and vomiting.  Work-up significant for acute calculus cholecystitis.  CT scan also showed mild inflammatory changes of the descending and sigmoid colon.  Clinically the patient had no signs of diverticulitis, without lower abdominal tenderness, fever,  or changes in bowel habits.  Patient was admitted and underwent procedure listed above.  Tolerated procedure well and was transferred to the floor.  Diet was advanced as tolerated.  On POD #1, the patient was voiding well, tolerating diet, ambulating well, pain well controlled, vital signs stable, incisions c/d/i and felt stable for discharge home.  Patient will follow up in our office in 2 weeks and knows to call with questions or concerns.  She was given an outpatient referral to GI for colonoscopy, as she has never had a screening colonoscopy.  I have personally reviewed the patients medication history on the [place] controlled substance database.  Physical Exam: General:  Alert, NAD, pleasant, comfortable Abd:  Soft, ND, incisions clean dry and intact, appropriate tender  Allergies as of 01/18/2021   No Known Allergies      Medication List     TAKE these medications    acetaminophen 500 MG tablet Commonly known as: TYLENOL Take 2 tablets (1,000 mg total) by mouth every 6 (six) hours as needed.   albuterol 108 (90 Base) MCG/ACT inhaler Commonly known as: VENTOLIN HFA Inhale 1 puff into the lungs every 6 (six) hours as needed for wheezing or shortness of breath.   atorvastatin 10 MG tablet Commonly known as: LIPITOR Take 10 mg by mouth daily.   hydrochlorothiazide 25 MG tablet Commonly known as: HYDRODIURIL Take 25 mg by mouth daily.   montelukast 10 MG tablet Commonly known as: SINGULAIR Take 10 mg by mouth at bedtime.   multivitamin with minerals Tabs tablet Take 1 tablet by mouth daily.   oxyCODONE 5 MG immediate release tablet Commonly known as: Oxy IR/ROXICODONE Take 1 tablet (5 mg total) by mouth every 6 (six)  hours as needed for moderate pain or severe pain (no relieved by tylenol).   polyvinyl alcohol 1.4 % ophthalmic solution Commonly known as: LIQUIFILM TEARS Place 1 drop into both eyes as needed for dry eyes.   traZODone 50 MG tablet Commonly known as: DESYREL Take 50 mg by mouth at bedtime.          Follow-up [facility] Follow up.   Specialty: General Surgery Why: our office is scheduling you for post-operative follow up, please call to confirm appointment date/time. Contact information: [address] [place] [phone]                Signed: Obie Dredge, [facility] 01/18/2021, 10:53 AM

## 2021-01-18 NOTE — Discharge Instructions (Signed)
[facility] LAPAROSCOPIC SURGERY: POST OP INSTRUCTIONS Always review your discharge instruction sheet given to you by the facility where your surgery was performed. IF YOU HAVE DISABILITY OR FAMILY LEAVE FORMS, YOU MUST BRING THEM TO THE OFFICE FOR PROCESSING.   DO NOT GIVE THEM TO YOUR DOCTOR.  PAIN CONTROL  First take acetaminophen (Tylenol) AND/or ibuprofen (Advil) to control your pain after surgery.  Follow directions on package.  Taking acetaminophen (Tylenol) and/or ibuprofen (Advil) regularly after surgery will help to control your pain and lower the amount of prescription pain medication you may need.  You should not take more than 3,000 mg (3 grams) of acetaminophen (Tylenol) in 24 hours.  You should not take ibuprofen (Advil), aleve, motrin, naprosyn or other NSAIDS if you have a history of stomach ulcers or chronic kidney disease.  A prescription for pain medication may be given to you upon discharge.  Take your pain medication as prescribed, if you still have uncontrolled pain after taking acetaminophen (Tylenol) or ibuprofen (Advil). Use ice packs to help control pain. If you need a refill on your pain medication, please contact your pharmacy.  They will contact our office to request authorization. Prescriptions will not be filled after 5pm or on week-ends.  HOME MEDICATIONS Take your usually prescribed medications unless otherwise directed.  DIET You should follow a light diet the first few days after arrival home.  Be sure to include lots of fluids daily. Avoid fatty, fried foods.   CONSTIPATION It is common to experience some constipation after surgery and if you are taking pain medication.  Increasing fluid intake and taking a stool softener (such as Colace) will usually help or prevent this problem from occurring.  A mild laxative (Milk of Magnesia or Miralax) should be taken according to package instructions if there are no bowel movements after 48  hours.  WOUND/INCISION CARE Most patients will experience some swelling and bruising in the area of the incisions.  Ice packs will help.  Swelling and bruising can take several days to resolve.  Unless discharge instructions indicate otherwise, follow guidelines below  STERI-STRIPS - you may remove your outer bandages 48 hours after surgery, and you may shower at that time.  You have steri-strips (small skin tapes) in place directly over the incision.  These strips should be left on the skin for 7-10 days.   DERMABOND/SKIN GLUE - you may shower in 24 hours.  The glue will flake off over the next 2-3 weeks. Any sutures or staples will be removed at the office during your follow-up visit.  ACTIVITIES You may resume regular (light) daily activities beginning the next day--such as daily self-care, walking, climbing stairs--gradually increasing activities as tolerated.  You may have sexual intercourse when it is comfortable.  Refrain from any heavy lifting or straining until approved by your doctor. You may drive when you are no longer taking prescription pain medication, you can comfortably wear a seatbelt, and you can safely maneuver your car and apply brakes.  FOLLOW-UP You should see your doctor in the office for a follow-up appointment approximately 2-3 weeks after your surgery.  You should have been given your post-op/follow-up appointment when your surgery was scheduled.  If you did not receive a post-op/follow-up appointment, make sure that you call for this appointment within a day or two after you arrive home to insure a convenient appointment time.   WHEN TO CALL YOUR DOCTOR: Fever over 101.0 Inability to urinate Continued bleeding from incision.   Increased pain, redness, or drainage from the incision. Increasing abdominal pain  The clinic staff is available to answer your questions during regular business hours.  Please don't hesitate to call and ask to speak to one of the nurses for  clinical concerns.  If you have a medical emergency, go to the nearest emergency room or call 911.  A surgeon from [facility] is always on call at the hospital. [address], [address], [place], [place]  [address] ? [address] [address], , [place]   [address] [phone] ? [phone] ? FAX [phone] Web site: [email or web address]  

## 2021-01-18 NOTE — Anesthesia Postprocedure Evaluation (Signed)
Anesthesia Post Note  Patient: Jaime Thomas  Procedure(s) Performed: LAPAROSCOPIC CHOLECYSTECTOMY (Abdomen)     Patient location during evaluation: PACU Anesthesia Type: General Level of consciousness: awake Pain management: pain level controlled Vital Signs Assessment: post-procedure vital signs reviewed and stable Respiratory status: spontaneous breathing Cardiovascular status: stable Postop Assessment: no apparent nausea or vomiting Anesthetic complications: no   No notable events documented.  Last Vitals:  Vitals:   01/18/21 0224 01/18/21 1002  BP: 126/76 (!) 145/77  Pulse: 76 77  Resp: 17 20  Temp: 36.9 C 36.7 C  SpO2: 99% 95%    Last Pain:  Vitals:   01/18/21 1002  TempSrc: Oral  PainSc:                  Tanav Orsak

## 2021-01-18 NOTE — Progress Notes (Signed)
Discharge package printed and instructions given to patient. Patient verbalizes understanding. 

## 2021-01-18 NOTE — Progress Notes (Signed)
Transition of Care [facility]) Screening Note  Patient Details  Name: Kennette Cuthrell Date of Birth: 06-22-1955  Transition of Care [facility]) CM/SW Contact:    Sherie Don, LCSW Phone Number: 01/18/2021, 9:25 AM  Transition of Care Department [facility]) has reviewed patient and no TOC needs have been identified at this time. We will continue to monitor patient advancement through interdisciplinary progression rounds. If new patient transition needs arise, please place a TOC consult.

## 2021-01-18 NOTE — Plan of Care (Signed)
  Problem: Nutrition: Goal: Adequate nutrition will be maintained Outcome: Progressing   Problem: Safety: Goal: Ability to remain free from injury will improve Outcome: Progressing   Problem: Pain Managment: Goal: General experience of comfort will improve Outcome: Progressing   

## 2021-07-14 ENCOUNTER — Emergency Department (HOSPITAL_BASED_OUTPATIENT_CLINIC_OR_DEPARTMENT_OTHER): Payer: Commercial Managed Care - PPO

## 2021-07-14 ENCOUNTER — Encounter (HOSPITAL_BASED_OUTPATIENT_CLINIC_OR_DEPARTMENT_OTHER): Payer: Self-pay

## 2021-07-14 ENCOUNTER — Emergency Department (HOSPITAL_BASED_OUTPATIENT_CLINIC_OR_DEPARTMENT_OTHER)
Admission: EM | Admit: 2021-07-14 | Discharge: 2021-07-14 | Disposition: A | Discharge status: Home / Self Care | Payer: Commercial Managed Care - PPO | Attending: Emergency Medicine | Admitting: Emergency Medicine

## 2021-07-14 ENCOUNTER — Other Ambulatory Visit: Payer: Self-pay

## 2021-07-14 DIAGNOSIS — Z79899 Other long term (current) drug therapy: Secondary | ICD-10-CM | POA: Diagnosis not present

## 2021-07-14 DIAGNOSIS — N824 Other female intestinal-genital tract fistulae: Secondary | ICD-10-CM

## 2021-07-14 DIAGNOSIS — K5792 Diverticulitis of intestine, part unspecified, without perforation or abscess without bleeding: Secondary | ICD-10-CM | POA: Insufficient documentation

## 2021-07-14 DIAGNOSIS — N823 Fistula of vagina to large intestine: Secondary | ICD-10-CM | POA: Diagnosis not present

## 2021-07-14 DIAGNOSIS — N898 Other specified noninflammatory disorders of vagina: Secondary | ICD-10-CM | POA: Diagnosis present

## 2021-07-14 LAB — BASIC METABOLIC PANEL
Anion gap: 8 (ref 5–15)
BUN: 10 mg/dL (ref 8–23)
CO2: 25 mmol/L (ref 22–32)
Calcium: 9.1 mg/dL (ref 8.9–10.3)
Chloride: 108 mmol/L (ref 98–111)
Creatinine, Ser: 0.79 mg/dL (ref 0.44–1.00)
GFR, Estimated: >60 mL/min (ref >60)
Glucose, Bld: 119 mg/dL — ABNORMAL HIGH (ref 70–99)
Potassium: 3.3 mmol/L — ABNORMAL LOW (ref 3.5–5.1)
Sodium: 141 mmol/L (ref 135–145)

## 2021-07-14 LAB — CBC WITH DIFFERENTIAL/PLATELET
Abs Immature Granulocytes: 0 10*3/uL (ref 0.00–0.07)
Basophils Absolute: 0 10*3/uL (ref 0.0–0.1)
Basophils Relative: 1 %
Eosinophils Absolute: 0 10*3/uL (ref 0.0–0.5)
Eosinophils Relative: 1 %
HCT: 41.4 % (ref 36.0–46.0)
Hemoglobin: 13.7 g/dL (ref 12.0–15.0)
Immature Granulocytes: 0 %
Lymphocytes Relative: 36 %
Lymphs Abs: 1.7 10*3/uL (ref 0.7–4.0)
MCH: 26.6 pg (ref 26.0–34.0)
MCHC: 33.1 g/dL (ref 30.0–36.0)
MCV: 80.2 fL (ref 80.0–100.0)
Monocytes Absolute: 0.5 10*3/uL (ref 0.1–1.0)
Monocytes Relative: 11 %
Neutro Abs: 2.4 10*3/uL (ref 1.7–7.7)
Neutrophils Relative %: 51 %
Platelets: 324 10*3/uL (ref 150–400)
RBC: 5.16 MIL/uL — ABNORMAL HIGH (ref 3.87–5.11)
RDW: 13.8 % (ref 11.5–15.5)
WBC: 4.6 10*3/uL (ref 4.0–10.5)
nRBC: 0 % (ref 0.0–0.2)

## 2021-07-14 LAB — URINALYSIS, ROUTINE W REFLEX MICROSCOPIC
Bilirubin Urine: NEGATIVE
Glucose, UA: NEGATIVE mg/dL
Ketones, ur: NEGATIVE mg/dL
Nitrite: NEGATIVE
Protein, ur: NEGATIVE mg/dL
Specific Gravity, Urine: 1.025 (ref 1.005–1.030)
pH: 6 (ref 5.0–8.0)

## 2021-07-14 LAB — WET PREP, GENITAL
Clue Cells Wet Prep HPF POC: NONE SEEN
Sperm: NONE SEEN
Trich, Wet Prep: NONE SEEN
WBC, Wet Prep HPF POC: >=10 — AB (ref <10)
Yeast Wet Prep HPF POC: NONE SEEN

## 2021-07-14 LAB — URINALYSIS, MICROSCOPIC (REFLEX)

## 2021-07-14 MED ORDER — IOHEXOL 300 MG/ML  SOLN
100.0000 mL | Freq: Once | INTRAMUSCULAR | Status: AC | PRN
  Administered 2021-07-14: 100 mL via INTRAVENOUS

## 2021-07-14 MED ORDER — AMOXICILLIN-POT CLAVULANATE 875-125 MG PO TABS: 1.0000 | ORAL_TABLET | Freq: Two times a day (BID) | ORAL | 0 refills | Status: DC

## 2021-07-14 MED ORDER — AMOXICILLIN-POT CLAVULANATE 875-125 MG PO TABS
1.0000 | ORAL_TABLET | Freq: Once | ORAL | Status: AC
  Administered 2021-07-14: 1 via ORAL
  Filled 2021-07-14: qty 1

## 2021-07-14 NOTE — Discharge Instructions (Addendum)
Make an appointment to follow-up with both the surgeon and the gynecologist.  Return to the emergency room if you have any worsening symptoms.

## 2021-07-14 NOTE — ED Provider Notes (Signed)
[place] EMERGENCY DEPARTMENT Provider Note   CSN: [id number] Arrival date & time: 07/14/21  5102     History  Chief Complaint  Patient presents with   Vaginal Discharge   Flank Pain    Jaime Thomas is a 66 y.o. female.  Patient is a 66 year old female who presents with vaginal discharge.  She said that a couple days ago it started with a little bit of light bloody discharge and now she felt like this morning she may have had some stool coming from her vagina.  She had a normal bowel movement yesterday.  She has a little bit of cramping in her lower abdomen.  She has some pain in her left back.  No nausea or vomiting.  No fevers.  No urinary symptoms.  She is status post hysterectomy many years ago.  She has had prior history of BV but no other vaginal infections.  She is not sexually active currently.       Home Medications Prior to Admission medications   Medication Sig Start Date End Date Taking? Authorizing Provider  amoxicillin-clavulanate (AUGMENTIN) 875-125 MG tablet Take 1 tablet by mouth every 12 (twelve) hours. 07/14/21  Yes Malvin Johns, MD  acetaminophen (TYLENOL) 500 MG tablet Take 2 tablets (1,000 mg total) by mouth every 6 (six) hours as needed. 01/18/21   Jill Alexanders, PA-C  albuterol (VENTOLIN HFA) 108 (90 Base) MCG/ACT inhaler Inhale 1 puff into the lungs every 6 (six) hours as needed for wheezing or shortness of breath. 12/14/20   [provider]  atorvastatin (LIPITOR) 10 MG tablet Take 10 mg by mouth daily. 02/14/16   [provider]  hydrochlorothiazide (HYDRODIURIL) 25 MG tablet Take 25 mg by mouth daily. 10/26/20   [provider]  montelukast (SINGULAIR) 10 MG tablet Take 10 mg by mouth at bedtime. 12/14/20   [provider]  Multiple Vitamin (MULTIVITAMIN WITH MINERALS) TABS tablet Take 1 tablet by mouth daily.    [provider]  oxyCODONE (OXY IR/ROXICODONE) 5 MG immediate release tablet Take 1  tablet (5 mg total) by mouth every 6 (six) hours as needed for moderate pain or severe pain (no relieved by tylenol). 01/18/21   Jill Alexanders, PA-C  polyvinyl alcohol (LIQUIFILM TEARS) 1.4 % ophthalmic solution Place 1 drop into both eyes as needed for dry eyes.    [provider]  traZODone (DESYREL) 50 MG tablet Take 50 mg by mouth at bedtime. 11/03/20   [provider]      Allergies    Patient has no known allergies.    Review of Systems   Review of Systems  Constitutional:  Negative for chills, diaphoresis, fatigue and fever.  HENT:  Negative for congestion, rhinorrhea and sneezing.   Eyes: Negative.   Respiratory:  Negative for cough, chest tightness and shortness of breath.   Cardiovascular:  Negative for chest pain and leg swelling.  Gastrointestinal:  Positive for abdominal pain. Negative for blood in stool, diarrhea, nausea and vomiting.  Genitourinary:  Positive for vaginal bleeding and vaginal discharge. Negative for difficulty urinating, flank pain, frequency and hematuria.  Musculoskeletal:  Positive for back pain. Negative for arthralgias.  Skin:  Negative for rash.  Neurological:  Negative for dizziness, speech difficulty, weakness, numbness and headaches.    Physical Exam Updated Vital Signs BP 112/86   Pulse 70   Temp 98.6 F (37 C) (Oral)   Resp 16   SpO2 99%  Physical Exam Constitutional:  Appearance: She is well-developed.  HENT:     Head: Normocephalic and atraumatic.  Eyes:     Pupils: Pupils are equal, round, and reactive to light.  Cardiovascular:     Rate and Rhythm: Normal rate and regular rhythm.     Heart sounds: Normal heart sounds.  Pulmonary:     Effort: Pulmonary effort is normal. No respiratory distress.     Breath sounds: Normal breath sounds. No wheezing or rales.  Chest:     Chest Klimaszewski: No tenderness.  Abdominal:     General: Bowel sounds are normal.     Palpations: Abdomen is soft.     Tenderness: There  is no abdominal tenderness. There is no guarding or rebound.  Genitourinary:    Comments: Light brown watery discharge from the vaginal vault.  No visualized  fistula is noted.  No cervical motion tenderness or adnexal tenderness Musculoskeletal:        General: Normal range of motion.     Cervical back: Normal range of motion and neck supple.  Lymphadenopathy:     Cervical: No cervical adenopathy.  Skin:    General: Skin is warm and dry.     Findings: No rash.  Neurological:     Mental Status: She is alert and oriented to person, place, and time.     ED Results / Procedures / Treatments   Labs (all labs ordered are listed, but only abnormal results are displayed) Labs Reviewed  WET PREP, GENITAL - Abnormal; Notable for the following components:      Result Value   WBC, Wet Prep HPF POC >=10 (*)    All other components within normal limits  URINALYSIS, ROUTINE W REFLEX MICROSCOPIC - Abnormal; Notable for the following components:   Hgb urine dipstick SMALL (*)    Leukocytes,Ua TRACE (*)    All other components within normal limits  CBC WITH DIFFERENTIAL/PLATELET - Abnormal; Notable for the following components:   RBC 5.16 (*)    All other components within normal limits  BASIC METABOLIC PANEL - Abnormal; Notable for the following components:   Potassium 3.3 (*)    Glucose, Bld 119 (*)    All other components within normal limits  URINALYSIS, MICROSCOPIC (REFLEX) - Abnormal; Notable for the following components:   Bacteria, UA MANY (*)    All other components within normal limits  GC/CHLAMYDIA PROBE AMP ([place]) NOT AT [facility]    EKG None  Radiology CT Abdomen Pelvis W Contrast  Result Date: 07/14/2021 CLINICAL DATA:  Abdominal pain, acute, nonlocalized lower abd pain, brown vaginal discharge concerning for possible rectovaginal fistula EXAM: CT ABDOMEN AND PELVIS WITH CONTRAST TECHNIQUE: Multidetector CT imaging of the abdomen and pelvis was performed using the  standard protocol following bolus administration of intravenous contrast. RADIATION DOSE REDUCTION: This exam was performed according to the departmental dose-optimization program which includes automated exposure control, adjustment of the mA and/or kV according to patient size and/or use of iterative reconstruction technique. CONTRAST:  11m OMNIPAQUE IOHEXOL 300 MG/ML  SOLN COMPARISON:  CT AP, 01/17/2021.  KUB 10/20/2020. FINDINGS: Lower chest: Trace bibasilar atelectasis Hepatobiliary: Normal noncontrast appearance of the liver without focal abnormality. Status post cholecystectomy. No biliary dilatation. Pancreas: No pancreatic ductal dilatation or surrounding inflammatory changes. Spleen: Normal in size without focal abnormality. Adrenals/Urinary Tract: Adrenal glands are unremarkable. Kidneys are normal, without renal calculi, focal lesion, or hydronephrosis. Asymmetric LEFT posterolateral urinary bladder Kelson thickening. No foci of intraluminal gas within bladder. Stomach/Bowel: Stomach is  within normal limits. Appendix is normal. Moderate burden of distal descending colon and sigmoid diverticulosis, with inflammatory stranding along LEFT deep pelvis. Inflammatory process appears patent to LEFT adnexa and fornix with heterogeneous air-and-fluid content within the vaginal cuff. See key images. Vascular/Lymphatic: No significant vascular findings are present. No enlarged abdominal or pelvic lymph nodes. Reproductive: Status post hysterectomy. Additional findings as listed above. No RIGHT adnexal mass. Other: No abdominal Stegenga hernia or abnormality. No abdominopelvic ascites. Musculoskeletal: No acute osseous findings. IMPRESSION: 1. "Smoldering" sigmoid diverticulitis with persistent inflammation involving the LEFT adnexa, as described on 01/2021 comparison. 2. Extension of inflammation to involve the LEFT vaginal cuff and posterolateral bladder [place] with heterogeneous air and fluid content within the vaginal  cuff. Findings suspicious for developing colovaginal fistula. Correlate with pelvic examination. Electronically Signed   By: Michaelle Birks M.D.   On: 07/14/2021 10:46    Procedures Procedures    Medications Ordered in ED Medications  iohexol (OMNIPAQUE) 300 MG/ML solution 100 mL (100 mLs Intravenous Contrast Given 07/14/21 1004)  amoxicillin-clavulanate (AUGMENTIN) 875-125 MG per tablet 1 tablet (1 tablet Oral Given 07/14/21 1302)    ED Course/ Medical Decision Making/ A&P                           Medical Decision Making Amount and/or Complexity of Data Reviewed Labs: ordered. Radiology: ordered.  Risk Prescription drug management.   Patient is a 67 year old female who presents with some vaginal discharge.  On exam it appears that there is some potential stool in the vaginal vault.  Labs are obtained.  These are nonconcerning.  She had a CT scan of the abdomen pelvis which shows a probable colovaginal fistula.  There is also around the left adnexa some inflammatory changes consistent with diverticulitis which has persisted since her recent CT scan in January.  I consulted with Dr. Ilda Basset with gynecology.  He recommends follow-up with gynecology regarding the fistula.  I have given her information about following up with the clinic [facility] center.  Dr. Ilda Basset is also sent a note to the clinic regarding her follow-up.  He also recommended talking to general surgery regarding the diverticulitis.  I did speak with Dr. Thermon Leyland who recommends following up with the colorectal surgeons at their office.  It is unclear at this point whether the colorectal surgeons or the GYN doctors will repair the fistula but she may need a partial colectomy given the ongoing diverticulitis.  We will start her on Augmentin.  She otherwise is well-appearing.  She does not have significant abdominal tenderness, fevers or vomiting.  At this point I do not feel that she needs inpatient hospitalization.  She does  not need any pain control.  She was started on Augmentin and given the referrals for follow-up.  It was stressed to her to have close follow-up with these 2 groups of doctors.  Return precautions were given.  Final Clinical Impression(s) / ED Diagnoses Final diagnoses:  Colovaginal fistula  Diverticulitis    Rx / DC Orders ED Discharge Orders          Ordered    amoxicillin-clavulanate (AUGMENTIN) 875-125 MG tablet  Every 12 hours        07/14/21 1305              Malvin Johns, MD 07/14/21 1313

## 2021-07-14 NOTE — ED Triage Notes (Signed)
Patient states a few days ago she started spotting light red blood that has progressed into brown discharge. Also c/o white discharge. Also c/o left back pain. Denies n/v/d

## 2021-07-15 LAB — GC/CHLAMYDIA PROBE AMP (~~LOC~~) NOT AT ARMC
Chlamydia: NEGATIVE
Comment: NEGATIVE
Comment: NORMAL
Neisseria Gonorrhea: NEGATIVE

## 2021-07-31 ENCOUNTER — Ambulatory Visit (INDEPENDENT_AMBULATORY_CARE_PROVIDER_SITE_OTHER): Payer: Commercial Managed Care - PPO | Admitting: Obstetrics & Gynecology

## 2021-07-31 ENCOUNTER — Encounter: Payer: Self-pay | Admitting: Obstetrics & Gynecology

## 2021-07-31 VITALS — BP 150/87 | HR 96 | Ht 69.0 in | Wt 196.0 lb

## 2021-07-31 DIAGNOSIS — K5792 Diverticulitis of intestine, part unspecified, without perforation or abscess without bleeding: Secondary | ICD-10-CM | POA: Diagnosis not present

## 2021-07-31 DIAGNOSIS — Z1231 Encounter for screening mammogram for malignant neoplasm of breast: Secondary | ICD-10-CM

## 2021-07-31 DIAGNOSIS — N824 Other female intestinal-genital tract fistulae: Secondary | ICD-10-CM

## 2021-07-31 DIAGNOSIS — N760 Acute vaginitis: Secondary | ICD-10-CM

## 2021-07-31 MED ORDER — AMOXICILLIN-POT CLAVULANATE 875-125 MG PO TABS: 1.0000 | ORAL_TABLET | Freq: Two times a day (BID) | ORAL | 0 refills | Status: DC

## 2021-07-31 MED ORDER — METRONIDAZOLE 500 MG PO TABS: 500.0000 mg | ORAL_TABLET | Freq: Two times a day (BID) | ORAL | 0 refills | Status: DC

## 2021-07-31 NOTE — Progress Notes (Signed)
History:  66 y.o. [id number] here today for eval of RV fistula that was dx in the ED ~2 weeks prev. Pt reports that she noted stool coming from the vagina and went to the ED for eval. Pt is s/p 3 SVDs >40 years prev. She reports that she does believe that she had a rectal tear for one of them but, that was years ago. Pt has not had radiation but, she does report a h/o diverticulitis. She had sx recently which have resolved after Augmentin. She has completed her rx and is worried about recurrent sx or infxn due to the stool in the vagina. She smokes tob but, reports that she'd like to stop as this is scary for her.  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 No further treatment after hyst. She has not had a GYN exam since that time.   A CT done in the ED is suggestive of colovaginal fistula.   Has appt scheduled for Dr. Marcello Moores at [facility] on July 26th.    The following portions of the patient's history were reviewed and updated as appropriate: allergies, current medications, past family history, past medical history, past social history, past surgical history and problem list.  Review of Systems:  Pertinent items are noted in HPI.    Objective:  Physical Exam Blood pressure (!) 150/87, pulse 96, height '5\' 9"'$  (1.753 m), weight 196 lb (88.9 kg).  CONSTITUTIONAL: Well-developed, well-nourished female in no acute distress.  HENT:  Normocephalic, atraumatic EYES: Conjunctivae and EOM are normal. No scleral icterus.  NECK: Normal range of motion SKIN: Skin is warm and dry. No rash noted. Not diaphoretic.No pallor. [place]: Alert and oriented to person, place, and time. Normal coordination.  Abd: Soft, nontender and nondistended; well healed tranverse incision in lower abd.    Pelvic: Normal appearing external genitalia; there is stool coming from a lesion in the upper vagina near the cuff on the right side. There is induration of the vaginal cuff. No fluctuation noted.  Non tender.   The uterus and cervix are surgically absent. No other palpable masses. No adnexal tenderness  Labs and Imaging CT Abdomen Pelvis W Contrast  Result Date: 07/14/2021 CLINICAL DATA:  Abdominal pain, acute, nonlocalized lower abd pain, brown vaginal discharge concerning for possible rectovaginal fistula EXAM: CT ABDOMEN AND PELVIS WITH CONTRAST TECHNIQUE: Multidetector CT imaging of the abdomen and pelvis was performed using the standard protocol following bolus administration of intravenous contrast. RADIATION DOSE REDUCTION: This exam was performed according to the departmental dose-optimization program which includes automated exposure control, adjustment of the mA and/or kV according to patient size and/or use of iterative reconstruction technique. CONTRAST:  149m OMNIPAQUE IOHEXOL 300 MG/ML  SOLN COMPARISON:  CT AP, 01/17/2021.  KUB 10/20/2020. FINDINGS: Lower chest: Trace bibasilar atelectasis Hepatobiliary: Normal noncontrast appearance of the liver without focal abnormality. Status post cholecystectomy. No biliary dilatation. Pancreas: No pancreatic ductal dilatation or surrounding inflammatory changes. Spleen: Normal in size without focal abnormality. Adrenals/Urinary Tract: Adrenal glands are unremarkable. Kidneys are normal, without renal calculi, focal lesion, or hydronephrosis. Asymmetric LEFT posterolateral urinary bladder Burgert thickening. No foci of intraluminal gas within bladder. Stomach/Bowel: Stomach is within normal limits. Appendix is normal. Moderate burden of distal descending colon and sigmoid diverticulosis, with inflammatory stranding along LEFT deep pelvis. Inflammatory process appears patent to LEFT adnexa and fornix with heterogeneous air-and-fluid content within the vaginal cuff. See key images. Vascular/Lymphatic: No significant vascular findings are  present. No enlarged abdominal or pelvic lymph nodes. Reproductive: Status post hysterectomy. Additional findings as  listed above. No RIGHT adnexal mass. Other: No abdominal Reinheimer hernia or abnormality. No abdominopelvic ascites. Musculoskeletal: No acute osseous findings. IMPRESSION: 1. "Smoldering" sigmoid diverticulitis with persistent inflammation involving the LEFT adnexa, as described on 01/2021 comparison. 2. Extension of inflammation to involve the LEFT vaginal cuff and posterolateral bladder Boruff with heterogeneous air and fluid content within the vaginal cuff. Findings suspicious for developing colovaginal fistula. Correlate with pelvic examination. Electronically Signed   By: Michaelle Birks M.D.   On: 07/14/2021 10:46    Assessment & Plan:  Diagnoses and all orders for this visit:  Colovaginal fistula -     metroNIDAZOLE (FLAGYL) 500 MG tablet; Take 1 tablet (500 mg total) by mouth 2 (two) times daily. -     amoxicillin-clavulanate (AUGMENTIN) 875-125 MG tablet; Take 1 tablet by mouth 2 (two) times daily.  Diverticulitis -     metroNIDAZOLE (FLAGYL) 500 MG tablet; Take 1 tablet (500 mg total) by mouth 2 (two) times daily. -     amoxicillin-clavulanate (AUGMENTIN) 875-125 MG tablet; Take 1 tablet by mouth 2 (two) times daily.  Vaginal cuff cellulitis -     metroNIDAZOLE (FLAGYL) 500 MG tablet; Take 1 tablet (500 mg total) by mouth 2 (two) times daily. -     amoxicillin-clavulanate (AUGMENTIN) 875-125 MG tablet; Take 1 tablet by mouth 2 (two) times daily.  Breast cancer screening by mammogram -     MM Digital Screening; Future   Pt has f/u appt with Dr. Marcello Moores 08/07/2021  F/u in 3 months or sooner prn   Total face-to-face time with patient, review of chart, discussion with consultant and coordination of care was 44mn.    Birttany Dechellis L. Harraway-Smith, M.D., FCherlynn June

## 2021-07-31 NOTE — Progress Notes (Signed)
Patient following up for ED visit- colovaginal fistula.patient has appointment with [facility] surgery - Dr. Marcello Moores on July 26th. Kathrene Alu RN

## 2021-08-16 ENCOUNTER — Other Ambulatory Visit: Payer: Self-pay | Admitting: General Surgery

## 2021-08-16 DIAGNOSIS — K5732 Diverticulitis of large intestine without perforation or abscess without bleeding: Secondary | ICD-10-CM

## 2021-08-16 DIAGNOSIS — N824 Other female intestinal-genital tract fistulae: Secondary | ICD-10-CM

## 2021-08-27 ENCOUNTER — Ambulatory Visit
Admission: RE | Admit: 2021-08-27 | Discharge: 2021-08-27 | Disposition: A | Discharge status: Home / Self Care | Payer: Commercial Managed Care - PPO | Source: Ambulatory Visit | Attending: General Surgery | Admitting: General Surgery

## 2021-08-27 DIAGNOSIS — N824 Other female intestinal-genital tract fistulae: Secondary | ICD-10-CM

## 2021-08-27 DIAGNOSIS — K5732 Diverticulitis of large intestine without perforation or abscess without bleeding: Secondary | ICD-10-CM

## 2021-08-27 MED ORDER — IOPAMIDOL (ISOVUE-300) INJECTION 61%
100.0000 mL | Freq: Once | INTRAVENOUS | Status: AC | PRN
  Administered 2021-08-27: 100 mL via INTRAVENOUS

## 2021-09-02 ENCOUNTER — Inpatient Hospital Stay (HOSPITAL_BASED_OUTPATIENT_CLINIC_OR_DEPARTMENT_OTHER): Admission: RE | Admit: 2021-09-02 | Payer: Commercial Managed Care - PPO | Source: Ambulatory Visit

## 2021-10-01 ENCOUNTER — Ambulatory Visit: Payer: Self-pay | Admitting: General Surgery

## 2021-10-01 DIAGNOSIS — R634 Abnormal weight loss: Secondary | ICD-10-CM

## 2021-10-01 NOTE — H&P (Signed)
REFERRING PHYSICIAN:  Allyson Sabal, MD  PROVIDER:  Monico Blitz, MD  MRN: [id number] DOB: 1955-06-05 DATE OF ENCOUNTER: 10/01/2021  Subjective   Chief Complaint: Re-Check  (Diverticulitis and Colovaginal fistula )     History of Present Illness: Jaime Thomas is a 66 y.o. female who is seen today as an office consultation at the request of Dr. Tamera Punt for evaluation of Re-Check  (Diverticulitis and Colovaginal fistula ) .  66 year old female who presented to the emergency department in June 2023 with stool per vagina.  CT scan shows sigmoid diverticulitis with inflammation involving the left adnexa and left vaginal cuff as well as a posterior lateral bladder Petrucelli.  She has been on antibiotics for the past 3 weeks.  She has not noticed any decrease in drainage.  She has never had any abdominal pain.  She had 1 episode of diverticulitis approximately 6 months ago.  She is a smoker.  She does have a history of some tearing during childbirth.  She is status post hysterectomy and cholecystectomy.  Colonoscopy showed no masses or polyps.  Diverticulosis and hemorrhoids noted   Review of Systems: A complete review of systems was obtained from the patient.  I have reviewed this information and discussed as appropriate with the patient.  See HPI as well for other ROS.    Medical History: Past Medical History:  Diagnosis Date   Asthma, unspecified asthma severity, unspecified whether complicated, unspecified whether persistent    History of cancer    Hypertension     There is no problem list on file for this patient.   Past Surgical History:  Procedure Laterality Date   COLONOSCOPY  2023   CHOLECYSTECTOMY OPEN N/A    HYSTERECTOMY       No Known Allergies  Current Outpatient Medications on File Prior to Visit  Medication Sig Dispense Refill   amoxicillin-clavulanate (AUGMENTIN) 875-125 mg tablet Take 1 tablet (875 mg total) by mouth once daily for 36 days 36 tablet  0   atorvastatin (LIPITOR) 10 MG tablet Take 1 tablet by mouth once daily     hydroCHLOROthiazide (HYDRODIURIL) 25 MG tablet Take 1 tablet by mouth once daily     No current facility-administered medications on file prior to visit.    Family History  Problem Relation Age of Onset   High blood pressure (Hypertension) Mother    Hyperlipidemia (Elevated cholesterol) Mother    Diabetes Mother    High blood pressure (Hypertension) Sister    Hyperlipidemia (Elevated cholesterol) Sister    Diabetes Sister      Social History   Tobacco Use  Smoking Status Former   Types: Cigarettes  Smokeless Tobacco Never     Social History   Socioeconomic History   Marital status: Single  Tobacco Use   Smoking status: Former    Types: Cigarettes   Smokeless tobacco: Never  Vaping Use   Vaping Use: Never used  Substance and Sexual Activity   Alcohol use: Never   Drug use: Never    Objective:    There were no vitals filed for this visit.   Exam Gen: NAD CV: RRR Lungs: CTA Abd: soft    Labs, Imaging and Diagnostic Testing: CT images reviewed.  Patient appears to have focal stricture along the left pelvic sidewall with extensive inflammation and edema of the sigmoid colon along the left pelvic sidewall.      Assessment and Plan:  Diagnoses and all orders for this visit:  Colovaginal fistula  We discussed the management of this in detail today.  Her colonoscopy showed no signs of active inflammation.  CT scan appeared improved as well.  I think it is reasonable for her to come off antibiotics at this point.  We discussed that if she develops any further symptoms, she should try MiraLAX for a day or 2 and if her symptoms persist, she should call the office to check about going on antibiotics again prior to surgery.  We discussed the surgery in detail including removing the sigmoid colon and repairing the vaginal Gentz.  We discussed the need for ureter identification via urology  firefly injection.  We discussed the typical time of the operation as well as time in the hospital and postoperative recovery.  I recommended that she take 4 weeks off from work but she may be able to go back to work in 2 to 3 weeks.  The surgery and anatomy were described to the patient as well as the risks of surgery and the possible complications.  These include: Bleeding, deep abdominal infections and possible wound complications such as hernia and infection, damage to adjacent structures, leak of surgical connections, which can lead to other surgeries and possibly an ostomy, possible need for other procedures, such as abscess drains in radiology, possible prolonged hospital stay, possible diarrhea from removal of part of the colon, possible constipation from narcotics, possible bowel, bladder or sexual dysfunction if having rectal surgery, prolonged fatigue/weakness or appetite loss, possible early recurrence of of disease, possible complications of their medical problems such as heart disease or arrhythmias or lung problems, death (less than 1%). I believe the patient understands and wishes to proceed with the surgery.    Diverticulitis of colon   Rosario Adie, MD Colon and Rectal Surgery [facility]

## 2021-10-09 ENCOUNTER — Other Ambulatory Visit: Payer: Self-pay | Admitting: Urology

## 2021-10-30 ENCOUNTER — Ambulatory Visit: Payer: Commercial Managed Care - PPO | Admitting: Obstetrics and Gynecology

## 2021-11-18 NOTE — Progress Notes (Signed)
COVID Vaccine received:  _0  No _1  Yes Date of any COVID positive Test in last 90 days: none  PCP - Idamae Schuller, MD  Cardiologist - none  Chest x-ray - 10-26-2018  Epic EKG -  do at PST appt Stress Test -  ECHO -  Cardiac Cath -   PCR screen: _2  Ordered & Completed                      _3   No Order but Needs PROFEND                      _4   N/A for this surgery  Surgery Plan:  _5  Ambulatory                            _6  Outpatient in bed                            _7  Admit  Anesthesia:    _8  General  _9  Spinal                           _10   Choice _11   MAC  Bowel Prep - _12  No  _13   Yes ___encourage clears, Dulcolax, Miralax_  Pacemaker / ICD device _14  No _15  Yes        Device order form faxed _16  No    _17   Yes      Faxed to:  Spinal Cord Stimulator:_18  No _19  Yes      (Remind patient to bring remote DOS) Other Implants:   History of Sleep Apnea? _20  No _21  Yes   CPAP used?- _22  No _23  Yes    Does the patient monitor blood sugar? _24  No _25  Yes  _26  N/A  Blood Thinner / Instructions:  none Aspirin Instructions:none  ERAS Protocol Ordered: _27  No  _28  Yes PRE-SURGERY _29  ENSURE X 3   (2 evening prior, 1 morning of surgery)  Patient is to be NPO after: 0930  Comments: 2 Primary surgeons on this case: Dr. Marcello Moores will do partial colectomy and Dr. Gloriann Loan will do Fire Fly injection prior to colectomy. There are 2 signed consents.  Activity level: Patient can climb a flight of stairs without difficulty; _30  No CP  _31  No SOB.  Patient can perform ADLs without assistance.   Anesthesia review: HTN, still smokes some days but is trying to quit before surgery, asthma  Patient denies shortness of breath, fever, cough and chest pain at PAT appointment.  Patient verbalized understanding and agreement to the Pre-Surgical Instructions that were given to them at this PAT appointment. Patient was also educated of the need to review these PAT instructions again prior to his/her  surgery.I reviewed the appropriate phone numbers to call if they have any and questions or concerns.

## 2021-11-18 NOTE — Patient Instructions (Addendum)
SURGICAL WAITING ROOM VISITATION Patients having surgery or a procedure may have no more than 2 support people in the waiting area - these visitors may rotate in the visitor waiting room.   Children under the age of 34 must have an adult with them who is not the patient. If the patient needs to stay at the hospital during part of their recovery, the visitor guidelines for inpatient rooms apply.  PRE-OP VISITATION  Pre-op nurse will coordinate an appropriate time for 1 support person to accompany the patient in pre-op.  This support person may not rotate.  This visitor will be contacted when the time is appropriate for the visitor to come back in the pre-op area.  Please refer to the [facility] website for the visitor guidelines for Inpatients (after your surgery is over and you are in a regular room).  You are not required to quarantine at this time prior to your surgery. However, you must do this: Hand Hygiene often Do NOT share personal items Notify your provider if you are in close contact with someone who has COVID or you develop fever 100.4 or greater, new onset of sneezing, cough, sore throat, shortness of breath or body aches.  If you test positive for Covid or have been in contact with anyone that has tested positive in the last 10 days please notify you surgeon.    Your procedure is scheduled on:  Wednesday,  November 27, 2021  Report to [facility] Main Entrance: [address] entrance where the [employer] is available.   Report to admitting at: 10:15 AM  +++++Call this number if you have any questions or problems the morning of surgery [phone]  Do not eat food after Midnight the night prior to your surgery/procedure.  After Midnight you may have the following liquids until  09:30 AM DAY OF SURGERY  Clear Liquid Diet Water Black Coffee (sugar ok, NO MILK/CREAM OR CREAMERS)  Tea (sugar ok, NO MILK/CREAM OR CREAMERS) regular and decaf                              Plain Jell-O  with no fruit (NO RED)                                           Fruit ices (not with fruit pulp, NO RED)                                     Popsicles (NO RED)                                                                  Juice: apple, WHITE grape, WHITE cranberry Sports drinks like Gatorade or Powerade (NO RED)             Drink 2 bottles of Pre-Surgery Clear Ensure the evening prior to surgery. Also, Drinking extra water is encouraged to prevent dehydration from Bowel prep.         The day of surgery:  Drink ONE (1) Pre-Surgery  Clear Ensure at   09:30 AM the morning of surgery. Drink in one sitting. Do not sip.  This drink was given to you during your hospital pre-op appointment visit. Nothing else to drink after completing the Pre-Surgery Clear Ensure  : No candy, chewing gum or throat lozenges.    FOLLOW BOWEL PREP AND ANY ADDITIONAL PRE OP INSTRUCTIONS YOU RECEIVED FROM YOUR SURGEON'S OFFICE!!!  Dulcolax 20 mg - Take one (1) tablets with water at 07:00 am the day prior to surgery.  Miralax 255 g - Mix with 64 oz Gatorade/Powerade.  Starting at 10:00 am ,Drink this gradually over the next few hours (8 oz glass every 15-30 minutes) until gone the day prior to surgery You should finish in 4 hours-6 hours.    Drink plenty of clear liquids all evening to avoid getting dehydrated.     Oral Hygiene is also important to reduce your risk of infection.        Remember - BRUSH YOUR TEETH THE MORNING OF SURGERY WITH YOUR REGULAR TOOTHPASTE  Do NOT smoke after Midnight the night before surgery.  Take ONLY these medicines the morning of surgery with A SIP OF WATER: none, you may use your Albuterol Inhaler if needed.                    You may not have any metal on your body including hair pins, jewelry, and body piercing  Do not wear make-up, lotions, powders, perfumesor deodorant  Do not wear nail polish including gel and S&S, artificial / acrylic nails, or any other  type of covering on natural nails including finger and toenails. If you have artificial nails, gel coating, etc., that needs to be removed by a nail salon, Please have this removed prior to surgery. Not doing so may mean that your surgery could be cancelled or delayed if the Surgeon or anesthesia staff feels like they are unable to monitor you safely.   Do not shave 48 hours prior to surgery to avoid nicks in your skin which may contribute to postoperative infections.   Contacts, Hearing Aids, dentures or bridgework may not be worn into surgery.   You may bring a small overnight bag with you on the day of surgery, only pack items that are not valuable .[facility] IS NOT RESPONSIBLE   FOR VALUABLES THAT ARE LOST OR STOLEN.   Do not bring your home medications to the hospital. The Pharmacy will dispense medications listed on your medication list to you during your admission in the Hospital.  Special Instructions: Bring a copy of your healthcare power of attorney and living will documents the day of surgery, if you wish to have them scanned into your [facility] Records- EPIC  Please read over the following fact sheets you were given: IF YOU HAVE QUESTIONS ABOUT YOUR PRE-OP INSTRUCTIONS, PLEASE CALL [phone]  ([place])   [facility] - Preparing for Surgery Before surgery, you can play an important role.  Because skin is not sterile, your skin needs to be as free of germs as possible.  You can reduce the number of germs on your skin by washing with CHG (chlorahexidine gluconate) soap before surgery.  CHG is an antiseptic cleaner which kills germs and bonds with the skin to continue killing germs even after washing. Please DO NOT use if you have an allergy to CHG or antibacterial soaps.  If your skin becomes reddened/irritated stop using the CHG and inform your nurse when you arrive  at Short Stay. Do not shave (including legs and underarms) for at least 48 hours prior to the first CHG shower.   You may shave your face/neck.  Please follow these instructions carefully:  1.  Shower with CHG Soap the night before surgery and the  morning of surgery.  2.  If you choose to wash your hair, wash your hair first as usual with your normal  shampoo.  3.  After you shampoo, rinse your hair and body thoroughly to remove the shampoo.                             4.  Use CHG as you would any other liquid soap.  You can apply chg directly to the skin and wash.  Gently with a scrungie or clean washcloth.  5.  Apply the CHG Soap to your body ONLY FROM THE NECK DOWN.   Do not use on face/ open                           Wound or open sores. Avoid contact with eyes, ears mouth and genitals (private parts).                       Wash face,  Genitals (private parts) with your normal soap.             6.  Wash thoroughly, paying special attention to the area where your  surgery  will be performed.  7.  Thoroughly rinse your body with warm water from the neck down.  8.  DO NOT shower/wash with your normal soap after using and rinsing off the CHG Soap.            9.  Pat yourself dry with a clean towel.            10.  Wear clean pajamas.            11.  Place clean sheets on your bed the night of your first shower and do not  sleep with pets.  ON THE DAY OF SURGERY : Do not apply any lotions/deodorants the morning of surgery.  Please wear clean clothes to the hospital/surgery center.    FAILURE TO FOLLOW THESE INSTRUCTIONS MAY RESULT IN THE CANCELLATION OF YOUR SURGERY  PATIENT SIGNATURE_________________________________  NURSE SIGNATURE__________________________________  ________________________________________________________________________        Adam Phenix    An incentive spirometer is a tool that can help keep your lungs clear and active. This tool measures how well you are filling your lungs with each breath. Taking long deep breaths may help reverse or decrease the chance of  developing breathing (pulmonary) problems (especially infection) following: A long period of time when you are unable to move or be active. BEFORE THE PROCEDURE  If the spirometer includes an indicator to show your best effort, your nurse or respiratory therapist will set it to a desired goal. If possible, sit up straight or lean slightly forward. Try not to slouch. Hold the incentive spirometer in an upright position. INSTRUCTIONS FOR USE  Sit on the edge of your bed if possible, or sit up as far as you can in bed or on a chair. Hold the incentive spirometer in an upright position. Breathe out normally. Place the mouthpiece in your mouth and seal your lips tightly around it. Breathe in slowly and as deeply as  possible, raising the piston or the ball toward the top of the column. Hold your breath for 3-5 seconds or for as long as possible. Allow the piston or ball to fall to the bottom of the column. Remove the mouthpiece from your mouth and breathe out normally. Rest for a few seconds and repeat Steps 1 through 7 at least 10 times every 1-2 hours when you are awake. Take your time and take a few normal breaths between deep breaths. The spirometer may include an indicator to show your best effort. Use the indicator as a goal to work toward during each repetition. After each set of 10 deep breaths, practice coughing to be sure your lungs are clear. If you have an incision (the cut made at the time of surgery), support your incision when coughing by placing a pillow or rolled up towels firmly against it. Once you are able to get out of bed, walk around indoors and cough well. You may stop using the incentive spirometer when instructed by your caregiver.  RISKS AND COMPLICATIONS Take your time so you do not get dizzy or light-headed. If you are in pain, you may need to take or ask for pain medication before doing incentive spirometry. It is harder to take a deep breath if you are having pain. AFTER  USE Rest and breathe slowly and easily. It can be helpful to keep track of a log of your progress. Your caregiver can provide you with a simple table to help with this. If you are using the spirometer at home, follow these instructions: [place] IF:  You are having difficultly using the spirometer. You have trouble using the spirometer as often as instructed. Your pain medication is not giving enough relief while using the spirometer. You develop fever of 100.5 F (38.1 C) or higher.                                                                                                    SEEK IMMEDIATE MEDICAL CARE IF:  You cough up bloody sputum that had not been present before. You develop fever of 102 F (38.9 C) or greater. You develop worsening pain at or near the incision site. MAKE SURE YOU:  Understand these instructions. Will watch your condition. Will get help right away if you are not doing well or get worse. Document Released: 05/12/2006 Document Revised: 03/24/2011 Document Reviewed: 07/13/2006 [facility] Patient Information 2014 [place], [place].

## 2021-11-19 ENCOUNTER — Encounter (HOSPITAL_COMMUNITY): Payer: Self-pay

## 2021-11-19 ENCOUNTER — Other Ambulatory Visit: Payer: Self-pay

## 2021-11-19 ENCOUNTER — Encounter (HOSPITAL_COMMUNITY)
Admission: RE | Admit: 2021-11-19 | Discharge: 2021-11-19 | Disposition: A | Discharge status: Home / Self Care | Payer: Commercial Managed Care - PPO | Source: Ambulatory Visit | Attending: General Surgery | Admitting: General Surgery

## 2021-11-19 VITALS — BP 124/74 | HR 60 | Temp 97.9°F | Resp 20 | Ht 69.0 in | Wt 194.0 lb

## 2021-11-19 DIAGNOSIS — I10 Essential (primary) hypertension: Secondary | ICD-10-CM | POA: Diagnosis not present

## 2021-11-19 DIAGNOSIS — R634 Abnormal weight loss: Secondary | ICD-10-CM | POA: Insufficient documentation

## 2021-11-19 DIAGNOSIS — Z01818 Encounter for other preprocedural examination: Secondary | ICD-10-CM | POA: Insufficient documentation

## 2021-11-19 HISTORY — DX: Unspecified asthma, uncomplicated: J45.909

## 2021-11-19 LAB — COMPREHENSIVE METABOLIC PANEL
ALT: 12 U/L (ref 0–44)
AST: 18 U/L (ref 15–41)
Albumin: 3.9 g/dL (ref 3.5–5.0)
Alkaline Phosphatase: 78 U/L (ref 38–126)
Anion gap: 12 (ref 5–15)
BUN: 10 mg/dL (ref 8–23)
CO2: 27 mmol/L (ref 22–32)
Calcium: 9.8 mg/dL (ref 8.9–10.3)
Chloride: 104 mmol/L (ref 98–111)
Creatinine, Ser: 0.92 mg/dL (ref 0.44–1.00)
GFR, Estimated: >60 mL/min (ref >60)
Glucose, Bld: 97 mg/dL (ref 70–99)
Potassium: 3.4 mmol/L — ABNORMAL LOW (ref 3.5–5.1)
Sodium: 143 mmol/L (ref 135–145)
Total Bilirubin: 0.4 mg/dL (ref 0.3–1.2)
Total Protein: 7 g/dL (ref 6.5–8.1)

## 2021-11-19 LAB — CBC
HCT: 41.4 % (ref 36.0–46.0)
Hemoglobin: 13.3 g/dL (ref 12.0–15.0)
MCH: 26.8 pg (ref 26.0–34.0)
MCHC: 32.1 g/dL (ref 30.0–36.0)
MCV: 83.3 fL (ref 80.0–100.0)
Platelets: 297 10*3/uL (ref 150–400)
RBC: 4.97 MIL/uL (ref 3.87–5.11)
RDW: 13.4 % (ref 11.5–15.5)
WBC: 6.7 10*3/uL (ref 4.0–10.5)
nRBC: 0 % (ref 0.0–0.2)

## 2021-11-27 ENCOUNTER — Encounter (HOSPITAL_COMMUNITY): Payer: Self-pay | Admitting: General Surgery

## 2021-11-27 ENCOUNTER — Other Ambulatory Visit: Payer: Self-pay

## 2021-11-27 ENCOUNTER — Encounter (HOSPITAL_COMMUNITY)
Admission: RE | Disposition: A | Discharge status: Home / Self Care | Payer: Self-pay | Source: Home / Self Care | Attending: General Surgery

## 2021-11-27 ENCOUNTER — Inpatient Hospital Stay (HOSPITAL_COMMUNITY): Payer: Commercial Managed Care - PPO | Admitting: Anesthesiology

## 2021-11-27 ENCOUNTER — Inpatient Hospital Stay (HOSPITAL_COMMUNITY)
Admission: RE | Admit: 2021-11-27 | Discharge: 2021-11-29 | DRG: 330 | Disposition: A | Discharge status: Home / Self Care | Payer: Commercial Managed Care - PPO | Attending: General Surgery | Admitting: General Surgery

## 2021-11-27 DIAGNOSIS — Z87891 Personal history of nicotine dependence: Secondary | ICD-10-CM | POA: Diagnosis not present

## 2021-11-27 DIAGNOSIS — I10 Essential (primary) hypertension: Secondary | ICD-10-CM | POA: Diagnosis present

## 2021-11-27 DIAGNOSIS — K660 Peritoneal adhesions (postprocedural) (postinfection): Secondary | ICD-10-CM | POA: Diagnosis present

## 2021-11-27 DIAGNOSIS — N824 Other female intestinal-genital tract fistulae: Principal | ICD-10-CM | POA: Diagnosis present

## 2021-11-27 DIAGNOSIS — Z8249 Family history of ischemic heart disease and other diseases of the circulatory system: Secondary | ICD-10-CM | POA: Diagnosis not present

## 2021-11-27 DIAGNOSIS — Z90710 Acquired absence of both cervix and uterus: Secondary | ICD-10-CM

## 2021-11-27 DIAGNOSIS — N823 Fistula of vagina to large intestine: Secondary | ICD-10-CM | POA: Diagnosis present

## 2021-11-27 DIAGNOSIS — Z8542 Personal history of malignant neoplasm of other parts of uterus: Secondary | ICD-10-CM | POA: Diagnosis not present

## 2021-11-27 DIAGNOSIS — Z9049 Acquired absence of other specified parts of digestive tract: Secondary | ICD-10-CM | POA: Diagnosis not present

## 2021-11-27 DIAGNOSIS — K5732 Diverticulitis of large intestine without perforation or abscess without bleeding: Secondary | ICD-10-CM | POA: Diagnosis present

## 2021-11-27 DIAGNOSIS — Z79899 Other long term (current) drug therapy: Secondary | ICD-10-CM | POA: Diagnosis not present

## 2021-11-27 DIAGNOSIS — J45909 Unspecified asthma, uncomplicated: Secondary | ICD-10-CM | POA: Diagnosis present

## 2021-11-27 DIAGNOSIS — K5792 Diverticulitis of intestine, part unspecified, without perforation or abscess without bleeding: Secondary | ICD-10-CM

## 2021-11-27 LAB — TYPE AND SCREEN
ABO/RH(D): A POS
Antibody Screen: NEGATIVE

## 2021-11-27 LAB — ABO/RH: ABO/RH(D): A POS

## 2021-11-27 SURGERY — COLECTOMY, SIGMOID, ROBOT-ASSISTED
Anesthesia: General

## 2021-11-27 MED ORDER — ONDANSETRON HCL 4 MG/2ML IJ SOLN
4.0000 mg | Freq: Four times a day (QID) | INTRAMUSCULAR | Status: DC | PRN
  Administered 2021-11-27: 4 mg via INTRAVENOUS
  Filled 2021-11-27 (×2): qty 2

## 2021-11-27 MED ORDER — DIPHENHYDRAMINE HCL 12.5 MG/5ML PO ELIX: 12.5000 mg | ORAL_SOLUTION | Freq: Four times a day (QID) | ORAL | Status: DC | PRN

## 2021-11-27 MED ORDER — BUPIVACAINE HCL (PF) 0.25 % IJ SOLN
INTRAMUSCULAR | Status: DC | PRN
  Administered 2021-11-27: 30 mL

## 2021-11-27 MED ORDER — 0.9 % SODIUM CHLORIDE (POUR BTL) OPTIME
TOPICAL | Status: DC | PRN
  Administered 2021-11-27: 2000 mL
  Administered 2021-11-27: 1000 mL

## 2021-11-27 MED ORDER — SODIUM CHLORIDE 0.9 % IV SOLN
2.0000 g | INTRAVENOUS | Status: AC
  Administered 2021-11-27: 2 g via INTRAVENOUS
  Filled 2021-11-27: qty 2

## 2021-11-27 MED ORDER — LIDOCAINE HCL (CARDIAC) PF 100 MG/5ML IV SOSY
PREFILLED_SYRINGE | INTRAVENOUS | Status: DC | PRN
  Administered 2021-11-27: 50 mg via INTRAVENOUS

## 2021-11-27 MED ORDER — POLYETHYLENE GLYCOL 3350 17 GM/SCOOP PO POWD: 1.0000 | Freq: Once | ORAL | Status: DC

## 2021-11-27 MED ORDER — LACTATED RINGERS IV SOLN: INTRAVENOUS | Status: DC

## 2021-11-27 MED ORDER — PROPOFOL 10 MG/ML IV BOLUS
INTRAVENOUS | Status: AC
  Filled 2021-11-27: qty 20

## 2021-11-27 MED ORDER — LACTATED RINGERS IR SOLN
Status: DC | PRN
  Administered 2021-11-27: 1000 mL

## 2021-11-27 MED ORDER — ENOXAPARIN SODIUM 40 MG/0.4ML IJ SOSY
40.0000 mg | PREFILLED_SYRINGE | INTRAMUSCULAR | Status: DC
  Administered 2021-11-28 – 2021-11-29 (×2): 40 mg via SUBCUTANEOUS
  Filled 2021-11-27 (×2): qty 0.4

## 2021-11-27 MED ORDER — BUPIVACAINE HCL 0.25 % IJ SOLN
INTRAMUSCULAR | Status: AC
  Filled 2021-11-27: qty 1

## 2021-11-27 MED ORDER — DEXAMETHASONE SODIUM PHOSPHATE 10 MG/ML IJ SOLN
INTRAMUSCULAR | Status: DC | PRN
  Administered 2021-11-27: 4 mg via INTRAVENOUS

## 2021-11-27 MED ORDER — AMISULPRIDE (ANTIEMETIC) 5 MG/2ML IV SOLN
10.0000 mg | Freq: Once | INTRAVENOUS | Status: AC | PRN
  Administered 2021-11-27: 10 mg via INTRAVENOUS

## 2021-11-27 MED ORDER — MIDAZOLAM HCL 2 MG/2ML IJ SOLN
INTRAMUSCULAR | Status: AC
  Filled 2021-11-27: qty 2

## 2021-11-27 MED ORDER — PROPOFOL 10 MG/ML IV BOLUS
INTRAVENOUS | Status: DC | PRN
  Administered 2021-11-27: 150 mg via INTRAVENOUS

## 2021-11-27 MED ORDER — POTASSIUM CHLORIDE ER 10 MEQ PO TBCR
10.0000 meq | EXTENDED_RELEASE_TABLET | Freq: Every day | ORAL | Status: DC
  Administered 2021-11-27 – 2021-11-29 (×3): 10 meq via ORAL
  Filled 2021-11-27 (×6): qty 1

## 2021-11-27 MED ORDER — DEXMEDETOMIDINE HCL IN NACL 80 MCG/20ML IV SOLN
INTRAVENOUS | Status: DC | PRN
  Administered 2021-11-27: 8 ug via BUCCAL

## 2021-11-27 MED ORDER — HEPARIN SODIUM (PORCINE) 5000 UNIT/ML IJ SOLN
5000.0000 [IU] | Freq: Once | INTRAMUSCULAR | Status: AC
  Administered 2021-11-27: 5000 [IU] via SUBCUTANEOUS
  Filled 2021-11-27: qty 1

## 2021-11-27 MED ORDER — TRAMADOL HCL 50 MG PO TABS: 50.0000 mg | ORAL_TABLET | Freq: Four times a day (QID) | ORAL | Status: DC | PRN

## 2021-11-27 MED ORDER — LIDOCAINE HCL (CARDIAC) PF 100 MG/5ML IV SOSY: PREFILLED_SYRINGE | INTRAVENOUS | Status: DC | PRN

## 2021-11-27 MED ORDER — LABETALOL HCL 5 MG/ML IV SOLN
INTRAVENOUS | Status: AC
  Filled 2021-11-27: qty 4

## 2021-11-27 MED ORDER — ONDANSETRON HCL 4 MG/2ML IJ SOLN
INTRAMUSCULAR | Status: AC
  Filled 2021-11-27: qty 2

## 2021-11-27 MED ORDER — STERILE WATER FOR INJECTION IJ SOLN
INTRAMUSCULAR | Status: AC
  Filled 2021-11-27: qty 10

## 2021-11-27 MED ORDER — ATORVASTATIN CALCIUM 10 MG PO TABS
10.0000 mg | ORAL_TABLET | Freq: Every day | ORAL | Status: DC
  Administered 2021-11-27 – 2021-11-29 (×3): 10 mg via ORAL
  Filled 2021-11-27 (×3): qty 1

## 2021-11-27 MED ORDER — ALBUTEROL SULFATE (2.5 MG/3ML) 0.083% IN NEBU: 2.5000 mg | INHALATION_SOLUTION | Freq: Four times a day (QID) | RESPIRATORY_TRACT | Status: DC | PRN

## 2021-11-27 MED ORDER — ENSURE PRE-SURGERY PO LIQD: 592.0000 mL | Freq: Once | ORAL | Status: DC

## 2021-11-27 MED ORDER — FENTANYL CITRATE (PF) 100 MCG/2ML IJ SOLN
INTRAMUSCULAR | Status: DC | PRN
  Administered 2021-11-27 (×5): 50 ug via INTRAVENOUS

## 2021-11-27 MED ORDER — BUPIVACAINE LIPOSOME 1.3 % IJ SUSP
INTRAMUSCULAR | Status: AC
  Filled 2021-11-27: qty 20

## 2021-11-27 MED ORDER — SACCHAROMYCES BOULARDII 250 MG PO CAPS
250.0000 mg | ORAL_CAPSULE | Freq: Two times a day (BID) | ORAL | Status: DC
  Administered 2021-11-27 – 2021-11-29 (×4): 250 mg via ORAL
  Filled 2021-11-27 (×4): qty 1

## 2021-11-27 MED ORDER — ACETAMINOPHEN 500 MG PO TABS
1000.0000 mg | ORAL_TABLET | Freq: Four times a day (QID) | ORAL | Status: DC
  Administered 2021-11-27 – 2021-11-29 (×5): 1000 mg via ORAL
  Filled 2021-11-27 (×5): qty 2

## 2021-11-27 MED ORDER — FENTANYL CITRATE (PF) 250 MCG/5ML IJ SOLN
INTRAMUSCULAR | Status: AC
  Filled 2021-11-27: qty 5

## 2021-11-27 MED ORDER — ENSURE SURGERY PO LIQD: 237.0000 mL | Freq: Two times a day (BID) | ORAL | Status: DC

## 2021-11-27 MED ORDER — ROCURONIUM BROMIDE 10 MG/ML (PF) SYRINGE
PREFILLED_SYRINGE | INTRAVENOUS | Status: AC
  Filled 2021-11-27: qty 10

## 2021-11-27 MED ORDER — ACETAMINOPHEN 500 MG PO TABS
1000.0000 mg | ORAL_TABLET | ORAL | Status: AC
  Administered 2021-11-27: 1000 mg via ORAL
  Filled 2021-11-27: qty 2

## 2021-11-27 MED ORDER — BUPIVACAINE LIPOSOME 1.3 % IJ SUSP
20.0000 mL | Freq: Once | INTRAMUSCULAR | Status: AC
  Administered 2021-11-27: 20 mL

## 2021-11-27 MED ORDER — SODIUM CHLORIDE 0.9 % IV SOLN
2.0000 g | Freq: Two times a day (BID) | INTRAVENOUS | Status: AC
  Administered 2021-11-28: 2 g via INTRAVENOUS
  Filled 2021-11-27: qty 2

## 2021-11-27 MED ORDER — TRAZODONE HCL 50 MG PO TABS
50.0000 mg | ORAL_TABLET | Freq: Every day | ORAL | Status: DC
  Administered 2021-11-28: 50 mg via ORAL
  Filled 2021-11-27 (×2): qty 1

## 2021-11-27 MED ORDER — SODIUM CHLORIDE 0.9 % IR SOLN
Status: DC | PRN
  Administered 2021-11-27: 1000 mL

## 2021-11-27 MED ORDER — ALVIMOPAN 12 MG PO CAPS
12.0000 mg | ORAL_CAPSULE | ORAL | Status: AC
  Administered 2021-11-27: 12 mg via ORAL
  Filled 2021-11-27: qty 1

## 2021-11-27 MED ORDER — DIPHENHYDRAMINE HCL 50 MG/ML IJ SOLN: 12.5000 mg | Freq: Four times a day (QID) | INTRAMUSCULAR | Status: DC | PRN

## 2021-11-27 MED ORDER — SPY AGENT GREEN - (INDOCYANINE FOR INJECTION)
INTRAMUSCULAR | Status: DC | PRN
  Administered 2021-11-27: 2 mL via INTRAVENOUS

## 2021-11-27 MED ORDER — SUGAMMADEX SODIUM 200 MG/2ML IV SOLN
INTRAVENOUS | Status: DC | PRN
  Administered 2021-11-27: 200 mg via INTRAVENOUS

## 2021-11-27 MED ORDER — PHENYLEPHRINE HCL-NACL 20-0.9 MG/250ML-% IV SOLN
INTRAVENOUS | Status: DC | PRN
  Administered 2021-11-27: 30 ug/min via INTRAVENOUS

## 2021-11-27 MED ORDER — ALUM & MAG HYDROXIDE-SIMETH 200-200-20 MG/5ML PO SUSP
30.0000 mL | Freq: Four times a day (QID) | ORAL | Status: DC | PRN
  Administered 2021-11-28 (×2): 30 mL via ORAL
  Filled 2021-11-27 (×2): qty 30

## 2021-11-27 MED ORDER — MONTELUKAST SODIUM 10 MG PO TABS
10.0000 mg | ORAL_TABLET | Freq: Every day | ORAL | Status: DC
  Administered 2021-11-28: 10 mg via ORAL
  Filled 2021-11-27 (×2): qty 1

## 2021-11-27 MED ORDER — AMISULPRIDE (ANTIEMETIC) 5 MG/2ML IV SOLN
INTRAVENOUS | Status: AC
  Filled 2021-11-27: qty 4

## 2021-11-27 MED ORDER — LIDOCAINE HCL (PF) 2 % IJ SOLN
INTRAMUSCULAR | Status: AC
  Filled 2021-11-27: qty 5

## 2021-11-27 MED ORDER — ALVIMOPAN 12 MG PO CAPS
12.0000 mg | ORAL_CAPSULE | Freq: Two times a day (BID) | ORAL | Status: DC
  Filled 2021-11-27: qty 1

## 2021-11-27 MED ORDER — KCL IN DEXTROSE-NACL 20-5-0.45 MEQ/L-%-% IV SOLN
INTRAVENOUS | Status: DC
  Filled 2021-11-27: qty 1000

## 2021-11-27 MED ORDER — BISACODYL 5 MG PO TBEC: 20.0000 mg | DELAYED_RELEASE_TABLET | Freq: Once | ORAL | Status: DC

## 2021-11-27 MED ORDER — MIDAZOLAM HCL 5 MG/5ML IJ SOLN
INTRAMUSCULAR | Status: DC | PRN
  Administered 2021-11-27 (×2): 1 mg via INTRAVENOUS

## 2021-11-27 MED ORDER — LABETALOL HCL 5 MG/ML IV SOLN
INTRAVENOUS | Status: DC | PRN
  Administered 2021-11-27 (×3): 5 mg via INTRAVENOUS

## 2021-11-27 MED ORDER — LIDOCAINE 20MG/ML (2%) 15 ML SYRINGE OPTIME
INTRAMUSCULAR | Status: DC | PRN
  Administered 2021-11-27: 1.5 mg/kg/h via INTRAVENOUS

## 2021-11-27 MED ORDER — LACTATED RINGERS IV SOLN: INTRAVENOUS | Status: DC | PRN

## 2021-11-27 MED ORDER — STERILE WATER FOR INJECTION IJ SOLN
INTRAMUSCULAR | Status: DC | PRN
  Administered 2021-11-27: 15 mL via INTRAVENOUS

## 2021-11-27 MED ORDER — CHLORHEXIDINE GLUCONATE 0.12 % MT SOLN
15.0000 mL | Freq: Once | OROMUCOSAL | Status: AC
  Administered 2021-11-27: 15 mL via OROMUCOSAL

## 2021-11-27 MED ORDER — GABAPENTIN 300 MG PO CAPS
300.0000 mg | ORAL_CAPSULE | ORAL | Status: AC
  Administered 2021-11-27: 300 mg via ORAL
  Filled 2021-11-27: qty 1

## 2021-11-27 MED ORDER — HYDROCHLOROTHIAZIDE 25 MG PO TABS
25.0000 mg | ORAL_TABLET | Freq: Every day | ORAL | Status: DC
  Administered 2021-11-27 – 2021-11-29 (×3): 25 mg via ORAL
  Filled 2021-11-27 (×3): qty 1

## 2021-11-27 MED ORDER — ORAL CARE MOUTH RINSE: 15.0000 mL | Freq: Once | OROMUCOSAL | Status: AC

## 2021-11-27 MED ORDER — ONDANSETRON HCL 4 MG/2ML IJ SOLN
INTRAMUSCULAR | Status: DC | PRN
  Administered 2021-11-27: 4 mg via INTRAVENOUS

## 2021-11-27 MED ORDER — ROCURONIUM BROMIDE 100 MG/10ML IV SOLN
INTRAVENOUS | Status: DC | PRN
  Administered 2021-11-27: 10 mg via INTRAVENOUS
  Administered 2021-11-27: 60 mg via INTRAVENOUS

## 2021-11-27 MED ORDER — ENSURE PRE-SURGERY PO LIQD
296.0000 mL | Freq: Once | ORAL | Status: DC
  Filled 2021-11-27: qty 296

## 2021-11-27 MED ORDER — ONDANSETRON HCL 4 MG/2ML IJ SOLN: 4.0000 mg | Freq: Once | INTRAMUSCULAR | Status: DC | PRN

## 2021-11-27 MED ORDER — GABAPENTIN 300 MG PO CAPS
300.0000 mg | ORAL_CAPSULE | Freq: Two times a day (BID) | ORAL | Status: DC
  Administered 2021-11-27 – 2021-11-29 (×4): 300 mg via ORAL
  Filled 2021-11-27 (×4): qty 1

## 2021-11-27 MED ORDER — KETOROLAC TROMETHAMINE 15 MG/ML IJ SOLN: 15.0000 mg | Freq: Once | INTRAMUSCULAR | Status: DC | PRN

## 2021-11-27 MED ORDER — ONDANSETRON HCL 4 MG PO TABS: 4.0000 mg | ORAL_TABLET | Freq: Four times a day (QID) | ORAL | Status: DC | PRN

## 2021-11-27 MED ORDER — FENTANYL CITRATE PF 50 MCG/ML IJ SOSY
PREFILLED_SYRINGE | INTRAMUSCULAR | Status: AC
  Filled 2021-11-27: qty 2

## 2021-11-27 MED ORDER — STERILE WATER FOR IRRIGATION IR SOLN
Status: DC | PRN
  Administered 2021-11-27: 1000 mL

## 2021-11-27 MED ORDER — DEXAMETHASONE SODIUM PHOSPHATE 10 MG/ML IJ SOLN
INTRAMUSCULAR | Status: AC
  Filled 2021-11-27: qty 1

## 2021-11-27 MED ORDER — FENTANYL CITRATE PF 50 MCG/ML IJ SOSY
25.0000 ug | PREFILLED_SYRINGE | INTRAMUSCULAR | Status: DC | PRN
  Administered 2021-11-27 (×3): 25 ug via INTRAVENOUS

## 2021-11-27 MED ORDER — ALBUTEROL SULFATE HFA 108 (90 BASE) MCG/ACT IN AERS: 1.0000 | INHALATION_SPRAY | Freq: Four times a day (QID) | RESPIRATORY_TRACT | Status: DC | PRN

## 2021-11-27 MED ORDER — HYDROMORPHONE HCL 1 MG/ML IJ SOLN
0.5000 mg | INTRAMUSCULAR | Status: DC | PRN
  Administered 2021-11-27: 0.5 mg via INTRAVENOUS
  Filled 2021-11-27: qty 0.5

## 2021-11-27 SURGICAL SUPPLY — 103 items
ADAPTER GOLDBERG URETERAL (ADAPTER) IMPLANT
BAG COUNTER SPONGE SURGICOUNT (BAG) ×1 IMPLANT
BAG URO CATCHER STRL LF (MISCELLANEOUS) ×1 IMPLANT
BLADE EXTENDED COATED 6.5IN (ELECTRODE) IMPLANT
CANNULA REDUC XI 12-8 STAPL (CANNULA)
CANNULA REDUCER 12-8 DVNC XI (CANNULA) IMPLANT
CATH URETL OPEN END 6FR 70 (CATHETERS) IMPLANT
CELLS DAT CNTRL 66122 CELL SVR (MISCELLANEOUS) IMPLANT
CLOTH BEACON ORANGE TIMEOUT ST (SAFETY) ×1 IMPLANT
COVER SURGICAL LIGHT HANDLE (MISCELLANEOUS) ×2 IMPLANT
COVER TIP SHEARS 8 DVNC (MISCELLANEOUS) ×1 IMPLANT
COVER TIP SHEARS 8MM DA VINCI (MISCELLANEOUS) ×1
DRAIN CHANNEL 19F RND (DRAIN) IMPLANT
DRAPE ARM DVNC X/XI (DISPOSABLE) ×4 IMPLANT
DRAPE COLUMN DVNC XI (DISPOSABLE) ×1 IMPLANT
DRAPE DA VINCI XI ARM (DISPOSABLE) ×4
DRAPE DA VINCI XI COLUMN (DISPOSABLE) ×1
DRAPE SURG IRRIG POUCH 19X23 (DRAPES) ×1 IMPLANT
DRSG OPSITE POSTOP 4X10 (GAUZE/BANDAGES/DRESSINGS) IMPLANT
DRSG OPSITE POSTOP 4X6 (GAUZE/BANDAGES/DRESSINGS) IMPLANT
DRSG OPSITE POSTOP 4X8 (GAUZE/BANDAGES/DRESSINGS) IMPLANT
ELECT PENCIL ROCKER SW 15FT (MISCELLANEOUS) ×1 IMPLANT
ELECT REM PT RETURN 15FT ADLT (MISCELLANEOUS) ×1 IMPLANT
ENDOLOOP SUT PDS II  0 18 (SUTURE)
ENDOLOOP SUT PDS II 0 18 (SUTURE) IMPLANT
EVACUATOR SILICONE 100CC (DRAIN) IMPLANT
GLOVE BIO SURGEON STRL SZ 6.5 (GLOVE) ×3 IMPLANT
GLOVE BIOGEL PI IND STRL 7.0 (GLOVE) ×2 IMPLANT
GLOVE INDICATOR 6.5 STRL GRN (GLOVE) ×1 IMPLANT
GLOVE SURG LX STRL 7.5 STRW (GLOVE) ×1 IMPLANT
GOWN SRG XL LVL 4 BRTHBL STRL (GOWNS) ×1 IMPLANT
GOWN STRL NON-REIN XL LVL4 (GOWNS) ×1
GOWN STRL REUS W/ TWL XL LVL3 (GOWN DISPOSABLE) ×3 IMPLANT
GOWN STRL REUS W/TWL XL LVL3 (GOWN DISPOSABLE) ×3
GRASPER SUT TROCAR 14GX15 (MISCELLANEOUS) IMPLANT
GUIDEWIRE ANG ZIPWIRE 038X150 (WIRE) IMPLANT
GUIDEWIRE STR DUAL SENSOR (WIRE) IMPLANT
HOLDER FOLEY CATH W/STRAP (MISCELLANEOUS) ×1 IMPLANT
IRRIG SUCT STRYKERFLOW 2 WTIP (MISCELLANEOUS) ×1
IRRIGATION SUCT STRKRFLW 2 WTP (MISCELLANEOUS) ×1 IMPLANT
KIT PROCEDURE DA VINCI SI (MISCELLANEOUS) ×1
KIT PROCEDURE DVNC SI (MISCELLANEOUS) ×1 IMPLANT
KIT TURNOVER KIT A (KITS) IMPLANT
MANIFOLD NEPTUNE II (INSTRUMENTS) ×1 IMPLANT
NDL INSUFFLATION 14GA 120MM (NEEDLE) ×1 IMPLANT
NEEDLE INSUFFLATION 14GA 120MM (NEEDLE) IMPLANT
PACK CARDIOVASCULAR III (CUSTOM PROCEDURE TRAY) ×1 IMPLANT
PACK COLON (CUSTOM PROCEDURE TRAY) ×1 IMPLANT
PACK CYSTO (CUSTOM PROCEDURE TRAY) ×1 IMPLANT
PAD POSITIONING PINK XL (MISCELLANEOUS) ×1 IMPLANT
RELOAD STAPLE 60 3.5 BLU DVNC (STAPLE) IMPLANT
RELOAD STAPLE 60 4.3 GRN DVNC (STAPLE) IMPLANT
RELOAD STAPLER 3.5X60 BLU DVNC (STAPLE) IMPLANT
RELOAD STAPLER 4.3X60 GRN DVNC (STAPLE) ×1 IMPLANT
RETRACTOR WND ALEXIS 18 MED (MISCELLANEOUS) IMPLANT
RTRCTR WOUND ALEXIS 18CM MED (MISCELLANEOUS)
SCISSORS LAP 5X35 DISP (ENDOMECHANICALS) IMPLANT
SEAL CANN UNIV 5-8 DVNC XI (MISCELLANEOUS) ×3 IMPLANT
SEAL XI 5MM-8MM UNIVERSAL (MISCELLANEOUS) ×5
SEALER VESSEL DA VINCI XI (MISCELLANEOUS) ×1
SEALER VESSEL EXT DVNC XI (MISCELLANEOUS) ×1 IMPLANT
SOLUTION ELECTROLUBE (MISCELLANEOUS) ×1 IMPLANT
SPIKE FLUID TRANSFER (MISCELLANEOUS) IMPLANT
STAPLER 60 DA VINCI SURE FORM (STAPLE) ×1
STAPLER 60 SUREFORM DVNC (STAPLE) IMPLANT
STAPLER CANNULA SEAL DVNC XI (STAPLE) IMPLANT
STAPLER CANNULA SEAL XI (STAPLE)
STAPLER ECHELON POWER CIR 29 (STAPLE) IMPLANT
STAPLER ECHELON POWER CIR 31 (STAPLE) IMPLANT
STAPLER RELOAD 3.5X60 BLU DVNC (STAPLE)
STAPLER RELOAD 3.5X60 BLUE (STAPLE)
STAPLER RELOAD 4.3X60 GREEN (STAPLE) ×1
STAPLER RELOAD 4.3X60 GRN DVNC (STAPLE) ×1
STOPCOCK 4 WAY LG BORE MALE ST (IV SETS) ×2 IMPLANT
SUT ETHILON 2 0 PS N (SUTURE) IMPLANT
SUT NOVA NAB GS-21 1 T12 (SUTURE) ×2 IMPLANT
SUT PROLENE 2 0 KS (SUTURE) IMPLANT
SUT SILK 2 0 (SUTURE) ×1
SUT SILK 2 0 SH CR/8 (SUTURE) IMPLANT
SUT SILK 2-0 18XBRD TIE 12 (SUTURE) ×1 IMPLANT
SUT SILK 3 0 (SUTURE)
SUT SILK 3 0 SH CR/8 (SUTURE) ×1 IMPLANT
SUT SILK 3-0 18XBRD TIE 12 (SUTURE) IMPLANT
SUT V-LOC BARB 180 2/0GR6 GS22 (SUTURE)
SUT VIC AB 2-0 SH 18 (SUTURE) IMPLANT
SUT VIC AB 2-0 SH 27 (SUTURE)
SUT VIC AB 2-0 SH 27X BRD (SUTURE) IMPLANT
SUT VIC AB 3-0 SH 18 (SUTURE) IMPLANT
SUT VIC AB 4-0 PS2 27 (SUTURE) ×2 IMPLANT
SUT VICRYL 0 UR6 27IN ABS (SUTURE) ×1 IMPLANT
SUTURE V-LC BRB 180 2/0GR6GS22 (SUTURE) IMPLANT
SYR 10ML ECCENTRIC (SYRINGE) ×1 IMPLANT
SYS LAPSCP GELPORT 120MM (MISCELLANEOUS)
SYS WOUND ALEXIS 18CM MED (MISCELLANEOUS)
SYSTEM LAPSCP GELPORT 120MM (MISCELLANEOUS) IMPLANT
SYSTEM WOUND ALEXIS 18CM MED (MISCELLANEOUS) IMPLANT
TOWEL OR 17X26 10 PK STRL BLUE (TOWEL DISPOSABLE) IMPLANT
TOWEL OR NON WOVEN STRL DISP B (DISPOSABLE) ×1 IMPLANT
TRAY FOLEY MTR SLVR 16FR STAT (SET/KITS/TRAYS/PACK) ×1 IMPLANT
TROCAR ADV FIXATION 5X100MM (TROCAR) ×1 IMPLANT
TUBING CONNECTING 10 (TUBING) ×3 IMPLANT
TUBING INSUFFLATION 10FT LAP (TUBING) ×1 IMPLANT
TUBING UROLOGY SET (TUBING) IMPLANT

## 2021-11-27 NOTE — Anesthesia Preprocedure Evaluation (Addendum)
Anesthesia Evaluation  Patient identified by MRN, date of birth, ID band Patient awake    Reviewed: Allergy & Precautions, NPO status , Patient's Chart, lab work & pertinent test results  Airway Mallampati: II  TM Distance: >3 FB Neck ROM: Full    Dental  (+) Edentulous Upper, Edentulous Lower   Pulmonary asthma , Patient abstained from smoking.   Pulmonary exam normal        Cardiovascular hypertension, Pt. on medications Normal cardiovascular exam     Neuro/Psych negative neurological ROS  negative psych ROS   GI/Hepatic negative GI ROS, Neg liver ROS,,,  Endo/Other  negative endocrine ROS    Renal/GU negative Renal ROS     Musculoskeletal negative musculoskeletal ROS (+)    Abdominal   Peds  Hematology negative hematology ROS (+)   Anesthesia Other Findings colovaginal fistula  Reproductive/Obstetrics                             Anesthesia Physical Anesthesia Plan  ASA: 2  Anesthesia Plan: General   Post-op Pain Management:    Induction: Intravenous  PONV Risk Score and Plan: 4 or greater and Ondansetron, Dexamethasone, Midazolam and Treatment may vary due to age or medical condition  Airway Management Planned: Oral ETT  Additional Equipment:   Intra-op Plan:   Post-operative Plan: Extubation in OR  Informed Consent: I have reviewed the patients History and Physical, chart, labs and discussed the procedure including the risks, benefits and alternatives for the proposed anesthesia with the patient or authorized representative who has indicated his/her understanding and acceptance.     Dental advisory given  Plan Discussed with: CRNA  Anesthesia Plan Comments:        Anesthesia Quick Evaluation

## 2021-11-27 NOTE — Anesthesia Procedure Notes (Signed)
Procedure Name: Intubation Date/Time: 11/27/2021 2:15 PM  Performed by: Garrel Ridgel, CRNAPre-anesthesia Checklist: Patient identified, Emergency Drugs available, Suction available and Patient being monitored Patient Re-evaluated:Patient Re-evaluated prior to induction Oxygen Delivery Method: Circle system utilized Preoxygenation: Pre-oxygenation with 100% oxygen Induction Type: IV induction Ventilation: Mask ventilation without difficulty Laryngoscope Size: Mac and 3 Grade View: Grade I Tube type: Oral Number of attempts: 1 Airway Equipment and Method: Stylet Placement Confirmation: ETT inserted through vocal cords under direct vision, positive ETCO2 and breath sounds checked- equal and bilateral Secured at: 21 cm Tube secured with: Tape Dental Injury: Teeth and Oropharynx as per pre-operative assessment

## 2021-11-27 NOTE — Op Note (Signed)
11/27/2021  4:39 PM  PATIENT:  Jaime Thomas  66 y.o. female  Patient Care Team: Loraine Leriche., MD as PCP - General (Internal Medicine)  PRE-OPERATIVE DIAGNOSIS:  colovaginal fistula  POST-OPERATIVE DIAGNOSIS:  colovaginal fistula  PROCEDURE:  XI ROBOT ASSISTED SIGMOIDECTOMY CYSTOSCOPY with FIREFLY INJECTION INTRAOPERATIVE ASSESSMENT OF PERFUSION    Surgeon(s): Lucas Mallow, MD Michael Boston, MD Leighton Ruff, MD  ASSISTANT: Dr Johney [place]   ANESTHESIA:   local and general  EBL: 69m Total I/O In: 1100 [I.V.:1000; IV Piggyback:100] Out: 250 [Urine:225; Blood:25]  Delay start of Pharmacological VTE agent (>24hrs) due to surgical blood loss or risk of bleeding:  no  DRAINS: none   SPECIMEN:  Source of Specimen:  Sigmoid   DISPOSITION OF SPECIMEN:  PATHOLOGY  COUNTS:  YES  PLAN OF CARE: Admit to inpatient   PATIENT DISPOSITION:  PACU - hemodynamically stable.  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  I recommended segmental resection:  The anatomy & physiology of the digestive tract was discussed.  The pathophysiology was discussed.  Natural history risks without surgery was discussed.   I worked to give an overview of the disease and the frequent need to have multispecialty involvement.  I feel the risks of no intervention will lead to serious problems that outweigh the operative risks; therefore, I recommended a partial colectomy to remove the pathology.  Laparoscopic & open techniques were discussed.   Risks such as bleeding, infection, abscess, leak, reoperation, possible ostomy, hernia, heart attack, death, and other risks were discussed.  I noted a good likelihood this will help address the problem.   Goals of post-operative recovery were discussed as well.    The patient expressed understanding & wished to proceed with surgery.  OR FINDINGS:   Patient had distal sigmoid to vaginal cuff fistula  The  anastomosis rests 12 cm from the anal verge by rigid proctoscopy.  DESCRIPTION:   Informed consent was confirmed.  The patient underwent general anaesthesia without difficulty.  The patient was positioned appropriately.  VTE prevention in place.  The patient's abdomen was clipped, prepped, & draped in a sterile fashion.  Surgical timeout confirmed our plan.  The patient was positioned in reverse Trendelenburg.  Abdominal entry was gained using a Varies needle in the LUQ.  Entry was clean.  I induced carbon dioxide insufflation.  An 842mrobotic port was placed in the RUQ.  Camera inspection revealed no injury.  Extra ports were carefully placed under direct laparoscopic visualization.  I laparoscopically reflected the greater omentum and the upper abdomen the small bowel in the upper abdomen. The patient was appropriately positioned and the robot was docked to the patient's left side.  Instruments were placed under direct visualization.    I mobilized the sigmoid colon off of the pelvic sidewall.  I took down the colonic adhesions to the abdominal [place] using the vessel sealer and blunt dissection.  Once I was completely around the fistula, I divided it with the vessel sealer. This freed the colon out of the pelvis.  I scored the base of peritoneum of the right side of the mesentery of the left colon from the ligament of Treitz to the peritoneal reflection of the mid rectum.  I elevated the sigmoid mesentery and enetered into the retro-mesenteric plane. We were able to identify the left ureter and gonadal vessels. We kept those posterior within the retroperitoneum and elevated the left colon mesentery off that. I  did isolated IMA pedicle but did not ligate it yet.  I continued distally and got into the avascular plane posterior to the mesorectum. This allowed me to help mobilize the rectum as well by freeing the mesorectum off the sacrum.  I mobilized the peritoneal coverings towards the peritoneal reflection  on both the right and left sides of the rectum.  I could see the right and left ureters and stayed away from them.    I skeletonized the inferior mesenteric artery pedicle.  After confirming the left ureter was out of the way, I went ahead and ligated the inferior mesenteric artery pedicle with bipolar robotic vessel sealer well above its takeoff from the aorta.   We ensured hemostasis. I skeletonized the mesorectum at the junction at the proximal rectum using blunt dissection & bipolar robotic vessel sealer.  I mobilized the left colon in a lateral to medial fashion off the line of [place]t up towards the splenic flexure to ensure good mobilization of the left colon to reach into the pelvis.  I then divided the mesentery to the level of the descending sigmoid junction using a robotic vessel sealer.  The mesorectal fat was also divided using the vessel sealer to the rectosigmoid junction.  A 12 mm suprapubic port was placed and a green load 60 mm robotic stapler was used to transect the rectosigmoid junction.  We then used firefly intravenously to evaluate for perfusion.  There was good perfusion of the remaining colon and rectum.  At this point the robot was undocked.  The 12 mm port was enlarged to a Pfannenstiel incision.  An [place] wound protector was placed.  The colon was brought out through this and transected over a pursestring device at the descending sigmoid junction.  A 29 mm EEA anvil was placed into the colon and the pursestring suture was tied tightly around this.  This was then placed back into the abdomen and an end-to-end anastomosis was created under laparoscopic visualization using the EEA stapler.  There was no tension noted on the anastomosis.  There was no leak when tested with insufflation under irrigation.  At this point we switched to clean gowns, gloves, instruments and drapes.  The peritoneum of the Pfannenstiel incision was closed using a running 0 Vicryl suture.  The fascia was closed  using 2, #1 Novafil running sutures.  The subcutaneous tissue was reapproximated using a running 2-0 Vicryl suture and the skin was closed using a running 4-0 Vicryl subcuticular suture.  A sterile dressing was applied.  The remaining port sites were closed using interrupted 4-0 Vicryl sutures and Dermabond.  The patient was then awakened from anesthesia and sent to the postanesthesia care unit in stable condition.  All counts were correct per operating room staff.  Rosario Adie, MD  Colorectal and [place] [facility]   An MD assistant was necessary for tissue manipulation, retraction and positioning due to the complexity of the case and hospital policies

## 2021-11-27 NOTE — H&P (Signed)
H&P Physician requesting consult: Leighton Ruff  Chief Complaint: Diverticulitis and colovaginal fistula  History of Present Illness: 66 year old female with diverticulitis and colovaginal fistula presents for partial colectomy.  Bilateral instillation of ureteral firefly requested.  Past Medical History:  Diagnosis Date   Asthma    Cancer ([place]) 2004   uterine cancer   Hypertension    Past Surgical History:  Procedure Laterality Date   ABDOMINAL HYSTERECTOMY     CHOLECYSTECTOMY N/A 01/17/2021   Procedure: LAPAROSCOPIC CHOLECYSTECTOMY;  Surgeon: Dwan Bolt, MD;  Location: [facility];  Service: General;  Laterality: N/A;    Home Medications:  Medications Prior to Admission  Medication Sig Dispense Refill Last Dose   albuterol (VENTOLIN HFA) 108 (90 Base) MCG/ACT inhaler Inhale 1 puff into the lungs every 6 (six) hours as needed for wheezing or shortness of breath.   11/27/2021 at 0930   atorvastatin (LIPITOR) 10 MG tablet Take 10 mg by mouth daily.   11/26/2021   hydrochlorothiazide (HYDRODIURIL) 25 MG tablet Take 25 mg by mouth daily.   11/26/2021   montelukast (SINGULAIR) 10 MG tablet Take 10 mg by mouth at bedtime.   11/26/2021   Multiple Vitamin (MULTIVITAMIN WITH MINERALS) TABS tablet Take 1 tablet by mouth daily.   Past Month   potassium chloride (KLOR-CON) 10 MEQ tablet Take 10 mEq by mouth daily.   11/26/2021   traZODone (DESYREL) 50 MG tablet Take 50 mg by mouth at bedtime.   11/26/2021   acetaminophen (TYLENOL) 500 MG tablet Take 2 tablets (1,000 mg total) by mouth every 6 (six) hours as needed. (Patient taking differently: Take 1,000 mg by mouth every 6 (six) hours as needed for mild pain or moderate pain.) 30 tablet 0 More than a month   Allergies: No Known Allergies  History reviewed. No pertinent family history. Social History:  reports that she has never smoked. She has never used smokeless tobacco. She reports that she does not drink alcohol and does not use  drugs.  ROS: A complete review of systems was performed.  All systems are negative except for pertinent findings as noted. ROS   Physical Exam:  Vital signs in last 24 hours: Temp:  [98.4 F (36.9 C)] 98.4 F (36.9 C) (11/15 1034) Pulse Rate:  [88] 88 (11/15 1034) Resp:  [20] 20 (11/15 1034) BP: (140)/(92) 140/92 (11/15 1034) SpO2:  [99 %] 99 % (11/15 1034) Weight:  [88 kg] 88 kg (11/15 1115) General:  Alert and oriented, No acute distress HEENT: Normocephalic, atraumatic Neck: No JVD or lymphadenopathy Cardiovascular: Regular rate and rhythm Lungs: Regular rate and effort Abdomen: Soft, nontender, nondistended, no abdominal masses Back: No CVA tenderness Extremities: No edema Neurologic: Grossly intact  Laboratory Data:  No results found for this or any previous visit (from the past 24 hour(s)). No results found for this or any previous visit (from the past 240 hour(s)). Creatinine: No results for input(s): "CREATININE" in the last 168 hours.  Impression/Assessment:  Diverticulitis  Plan:  Proceed with cystoscopy with bilateral ureteral catheterization with firefly instillation.  Risk and benefits discussed.  Marton Redwood, III 11/27/2021, 11:43 AM

## 2021-11-27 NOTE — Op Note (Signed)
Operative Note  Preoperative diagnosis:  1.  Diverticulitis and colovaginal fistula  Postoperative diagnosis: 1.  Same  Procedure(s): 1.  Cystoscopy with bilateral ureteral catheterization with instillation of firefly  Surgeon: Link Snuffer, MD  Assistants: None  Anesthesia: General  Complications: None immediate  EBL: Minimal  Specimens: 1.  None  Drains/Catheters: 1.  Foley catheter  Intraoperative findings: Normal urethra and bladder  Indication: 66 year old female presents for partial colectomy.  Ureteral instillation of firefly requested  Description of procedure:  The patient was identified and consent was obtained.  The patient was taken to the operating room and placed in the supine position.  The patient was placed under general anesthesia.  Perioperative antibiotics were administered.  The patient was placed in dorsal lithotomy.  Patient was prepped and draped in a standard sterile fashion and a timeout was performed.  A 21 French rigid cystoscope was advanced into the urethra and into the bladder.  Complete cystoscopy was performed with no abnormal findings.  The left ureter was cannulated with a sensor wire which was advanced up to the kidney.  An open-ended ureteral catheter was advanced over the wire and up to the proximal ureter.  Firefly was instilled as the ureteral catheter was slowly withdrawn.  I left the open-ended ureteral catheter in place for a couple minutes to allow for some absorption.  Same was performed on the right.  I withdrew the scope and placed a Foley catheter.  This concluded the operation.  Patient tolerated procedure well  Plan: As per general surgery

## 2021-11-27 NOTE — H&P (Signed)
REFERRING PHYSICIAN:  Allyson Sabal, MD   PROVIDER:  Monico Blitz, MD   MRN: [id number] DOB: 01/23/1955   Subjective    Chief Complaint: Re-Check  (Diverticulitis and Colovaginal fistula )       History of Present Illness: Jaime Thomas is a 66 y.o. female who is seen today as an office consultation at the request of Dr. Tamera Punt for evaluation of Re-Check  (Diverticulitis and Colovaginal fistula ) .  66 year old female who presented to the emergency department in June 2023 with stool per vagina.  CT scan shows sigmoid diverticulitis with inflammation involving the left adnexa and left vaginal cuff as well as a posterior lateral bladder Neider.  She has been on antibiotics for the past 3 weeks.  She has not noticed any decrease in drainage.  She has never had any abdominal pain.  She had 1 episode of diverticulitis approximately 6 months ago.  She is a smoker.  She does have a history of some tearing during childbirth.  She is status post hysterectomy and cholecystectomy.  Colonoscopy showed no masses or polyps.  Diverticulosis and hemorrhoids noted     Review of Systems: A complete review of systems was obtained from the patient.  I have reviewed this information and discussed as appropriate with the patient.  See HPI as well for other ROS.       Medical History:     Past Medical History:  Diagnosis Date   Asthma, unspecified asthma severity, unspecified whether complicated, unspecified whether persistent     History of cancer     Hypertension        There is no problem list on file for this patient.          Past Surgical History:  Procedure Laterality Date   COLONOSCOPY   2023   CHOLECYSTECTOMY OPEN N/A     HYSTERECTOMY          No Known Allergies         Current Outpatient Medications on File Prior to Visit  Medication Sig Dispense Refill   amoxicillin-clavulanate (AUGMENTIN) 875-125 mg tablet Take 1 tablet (875 mg total) by mouth once daily for 36  days 36 tablet 0   atorvastatin (LIPITOR) 10 MG tablet Take 1 tablet by mouth once daily       hydroCHLOROthiazide (HYDRODIURIL) 25 MG tablet Take 1 tablet by mouth once daily        No current facility-administered medications on file prior to visit.           Family History  Problem Relation Age of Onset   High blood pressure (Hypertension) Mother     Hyperlipidemia (Elevated cholesterol) Mother     Diabetes Mother     High blood pressure (Hypertension) Sister     Hyperlipidemia (Elevated cholesterol) Sister     Diabetes Sister        Social History        Tobacco Use  Smoking Status Former   Types: Cigarettes  Smokeless Tobacco Never      Social History         Socioeconomic History   Marital status: Single  Tobacco Use   Smoking status: Former      Types: Cigarettes   Smokeless tobacco: Never  Vaping Use   Vaping Use: Never used  Substance and Sexual Activity   Alcohol use: Never   Drug use: Never      Objective:      Vitals:  11/27/21 1034  BP: (!) 140/92  Pulse: 88  Resp: 20  Temp: 98.4 F (36.9 C)  SpO2: 99%      Exam Gen: NAD CV: RRR Lungs: CTA Abd: soft       Labs, Imaging and Diagnostic Testing: CT images reviewed.  Patient appears to have focal stricture along the left pelvic sidewall with extensive inflammation and edema of the sigmoid colon along the left pelvic sidewall.           Assessment and Plan:  Diagnoses and all orders for this visit:   Colovaginal fistula We discussed the management of this in detail today.  Her colonoscopy showed no signs of active inflammation.  CT scan appeared improved as well.  I think it is reasonable for her to come off antibiotics at this point.  We discussed that if she develops any further symptoms, she should try MiraLAX for a day or 2 and if her symptoms persist, she should call the office to check about going on antibiotics again prior to surgery.  We discussed the surgery in detail  including removing the sigmoid colon and repairing the vaginal Dupuy.  We discussed the need for ureter identification via urology firefly injection.  We discussed the typical time of the operation as well as time in the hospital and postoperative recovery.  I recommended that she take 4 weeks off from work but she may be able to go back to work in 2 to 3 weeks.   The surgery and anatomy were described to the patient as well as the risks of surgery and the possible complications.  These include: Bleeding, deep abdominal infections and possible wound complications such as hernia and infection, damage to adjacent structures, leak of surgical connections, which can lead to other surgeries and possibly an ostomy, possible need for other procedures, such as abscess drains in radiology, possible prolonged hospital stay, possible diarrhea from removal of part of the colon, possible constipation from narcotics, possible bowel, bladder or sexual dysfunction if having rectal surgery, prolonged fatigue/weakness or appetite loss, possible early recurrence of of disease, possible complications of their medical problems such as heart disease or arrhythmias or lung problems, death (less than 1%). I believe the patient understands and wishes to proceed with the surgery.      Diverticulitis of colon    Rosario Adie, MD Colon and Rectal Surgery [facility]

## 2021-11-27 NOTE — Transfer of Care (Signed)
Immediate Anesthesia Transfer of Care Note  Patient: Jaime Thomas  Procedure(s) Performed: XI ROBOT ASSISTED SIGMOID COLECTOMY CYSTOSCOPY with FIREFLY INJECTION  Patient Location: PACU  Anesthesia Type:General  Level of Consciousness: awake, alert , and oriented  Airway & Oxygen Therapy: Patient Spontanous Breathing and Patient connected to face mask oxygen  Post-op Assessment: Report given to RN and Post -op Vital signs reviewed and stable  Post vital signs: Reviewed and stable  Last Vitals:  Vitals Value Taken Time  BP 150/90 11/27/21 1653  Temp    Pulse 66 11/27/21 1656  Resp 21 11/27/21 1656  SpO2 100 % 11/27/21 1656  Vitals shown include unvalidated device data.  Last Pain:  Vitals:   11/27/21 1115  TempSrc:   PainSc: 0-No pain         Complications: No notable events documented.

## 2021-11-28 ENCOUNTER — Other Ambulatory Visit: Payer: Self-pay

## 2021-11-28 LAB — CBC
HCT: 39.1 % (ref 36.0–46.0)
Hemoglobin: 12.6 g/dL (ref 12.0–15.0)
MCH: 26.6 pg (ref 26.0–34.0)
MCHC: 32.2 g/dL (ref 30.0–36.0)
MCV: 82.5 fL (ref 80.0–100.0)
Platelets: 299 10*3/uL (ref 150–400)
RBC: 4.74 MIL/uL (ref 3.87–5.11)
RDW: 13.2 % (ref 11.5–15.5)
WBC: 11.3 10*3/uL — ABNORMAL HIGH (ref 4.0–10.5)
nRBC: 0 % (ref 0.0–0.2)

## 2021-11-28 LAB — BASIC METABOLIC PANEL
Anion gap: 7 (ref 5–15)
BUN: 6 mg/dL — ABNORMAL LOW (ref 8–23)
CO2: 26 mmol/L (ref 22–32)
Calcium: 8.9 mg/dL (ref 8.9–10.3)
Chloride: 104 mmol/L (ref 98–111)
Creatinine, Ser: 0.89 mg/dL (ref 0.44–1.00)
GFR, Estimated: >60 mL/min (ref >60)
Glucose, Bld: 117 mg/dL — ABNORMAL HIGH (ref 70–99)
Potassium: 3.4 mmol/L — ABNORMAL LOW (ref 3.5–5.1)
Sodium: 137 mmol/L (ref 135–145)

## 2021-11-28 MED ORDER — TRAMADOL HCL 50 MG PO TABS
50.0000 mg | ORAL_TABLET | Freq: Four times a day (QID) | ORAL | Status: DC | PRN
  Administered 2021-11-28: 100 mg via ORAL
  Filled 2021-11-28 (×3): qty 2

## 2021-11-28 MED ORDER — CHLORHEXIDINE GLUCONATE CLOTH 2 % EX PADS
6.0000 | MEDICATED_PAD | Freq: Every day | CUTANEOUS | Status: DC
  Administered 2021-11-28 – 2021-11-29 (×2): 6 via TOPICAL

## 2021-11-28 NOTE — Anesthesia Postprocedure Evaluation (Signed)
Anesthesia Post Note  Patient: Jaime Thomas  Procedure(s) Performed: XI ROBOT ASSISTED SIGMOID COLECTOMY CYSTOSCOPY with FIREFLY INJECTION     Patient location during evaluation: PACU Anesthesia Type: General Level of consciousness: awake Pain management: pain level controlled Vital Signs Assessment: post-procedure vital signs reviewed and stable Respiratory status: spontaneous breathing, nonlabored ventilation, respiratory function stable and patient connected to nasal cannula oxygen Cardiovascular status: blood pressure returned to baseline and stable Postop Assessment: no apparent nausea or vomiting Anesthetic complications: no   No notable events documented.  Last Vitals:  Vitals:   11/28/21 0109 11/28/21 0515  BP: 119/69 (!) 147/80  Pulse: 84 75  Resp: 18 18  Temp: 37.1 C 37.2 C  SpO2: 96% 96%    Last Pain:  Vitals:   11/28/21 0515  TempSrc: Oral  PainSc:                  Karyl Kinnier Marquize Seib

## 2021-11-28 NOTE — Progress Notes (Signed)
  Transition of Care [facility]) Screening Note   Patient Details  Name: Jaime Thomas Date of Birth: [birth year]   [facility]) CM/SW Contact:    Lennart Pall, LCSW Phone Number: 11/28/2021, 10:25 AM    Transition of Care Department [facility]) has reviewed patient and no TOC needs have been identified at this time. We will continue to monitor patient advancement through interdisciplinary progression rounds. If new patient transition needs arise, please place a TOC consult.

## 2021-11-28 NOTE — Progress Notes (Signed)
Mobility Specialist - Progress Note   11/28/21 1138  Mobility  Activity Ambulated independently in hallway  Level of Assistance Independent  Assistive Device None  Distance Ambulated (ft) 500 ft  Activity Response Tolerated well  Mobility Referral Yes  $Mobility charge 1 Mobility   Pt received in bed and agreeable to mobility. C/o abdomen pain near incisions. Pt to room standing after session with all needs met & nurse in room.    [facility]

## 2021-11-28 NOTE — Progress Notes (Signed)
Mobility Specialist - Progress Note   11/28/21 1446  Mobility  Activity Ambulated independently in hallway  Level of Assistance Independent  Assistive Device None  Distance Ambulated (ft) 500 ft  Activity Response Tolerated well  Mobility Referral Yes  $Mobility charge 1 Mobility   Pt received in bed and agreeable to mobility. No complaints during mobility. Pt to bed after session with all needs met.     [facility]

## 2021-11-28 NOTE — Progress Notes (Signed)
1 Day Post-Op Robotic Sigmoidectomy  Subjective: No acute issues, sore and bloated this am  Objective: Vital signs in last 24 hours: Temp:  [96.6 F (35.9 C)-98.9 F (37.2 C)] 98.9 F (37.2 C) (11/16 0515) Pulse Rate:  [57-88] 75 (11/16 0515) Resp:  [12-21] 18 (11/16 0515) BP: (119-174)/(69-100) 147/80 (11/16 0515) SpO2:  [91 %-100 %] 96 % (11/16 0515) Weight:  [86.4 kg-88 kg] 86.4 kg (11/16 0500)   Intake/Output from previous day: 11/15 0701 - 11/16 0700 In: 3634.9 [P.O.:840; I.V.:2594.9; IV Piggyback:200] Out: 2255 [Urine:2230; Blood:25] Intake/Output this shift: No intake/output data recorded.   General appearance: alert and cooperative GI: normal findings: soft  Incision: no significant drainage  Lab Results:  Recent Labs    11/28/21 0424  WBC 11.3*  HGB 12.6  HCT 39.1  PLT 299   BMET Recent Labs    11/28/21 0424  NA 137  K 3.4*  CL 104  CO2 26  GLUCOSE 117*  BUN 6*  CREATININE 0.89  CALCIUM 8.9   PT/INR No results for input(s): "LABPROT", "INR" in the last 72 hours. ABG No results for input(s): "PHART", "HCO3" in the last 72 hours.  Invalid input(s): "PCO2", "PO2"  MEDS, Scheduled  acetaminophen  1,000 mg Oral Q6H   alvimopan  12 mg Oral BID   atorvastatin  10 mg Oral Daily   Chlorhexidine Gluconate Cloth  6 each Topical Daily   enoxaparin (LOVENOX) injection  40 mg Subcutaneous Q24H   feeding supplement  237 mL Oral BID BM   gabapentin  300 mg Oral BID   hydrochlorothiazide  25 mg Oral Daily   montelukast  10 mg Oral QHS   potassium chloride  10 mEq Oral Daily   saccharomyces boulardii  250 mg Oral BID   traZODone  50 mg Oral QHS    Studies/Results: No results found.  Assessment: s/p Procedure(s): CYSTOSCOPY with FIREFLY INJECTION XI ROBOT ASSISTED SIGMOID COLECTOMY Patient Active Problem List   Diagnosis Date Noted   Colovaginal fistula 11/27/2021   Cholecystitis with cholelithiasis 01/17/2021    Expected post op  course  Plan: d/c foley Ambulate in hall PO pain meds SL IVFs  LOS: 1 day     .Rosario Adie, [facility], [place]    11/28/2021 7:42 AM

## 2021-11-29 LAB — CBC
HCT: 38.7 % (ref 36.0–46.0)
Hemoglobin: 12.4 g/dL (ref 12.0–15.0)
MCH: 26.6 pg (ref 26.0–34.0)
MCHC: 32 g/dL (ref 30.0–36.0)
MCV: 83 fL (ref 80.0–100.0)
Platelets: 253 10*3/uL (ref 150–400)
RBC: 4.66 MIL/uL (ref 3.87–5.11)
RDW: 13.3 % (ref 11.5–15.5)
WBC: 6.4 10*3/uL (ref 4.0–10.5)
nRBC: 0 % (ref 0.0–0.2)

## 2021-11-29 LAB — BASIC METABOLIC PANEL
Anion gap: 6 (ref 5–15)
BUN: 6 mg/dL — ABNORMAL LOW (ref 8–23)
CO2: 30 mmol/L (ref 22–32)
Calcium: 9.1 mg/dL (ref 8.9–10.3)
Chloride: 101 mmol/L (ref 98–111)
Creatinine, Ser: 0.75 mg/dL (ref 0.44–1.00)
GFR, Estimated: >60 mL/min (ref >60)
Glucose, Bld: 98 mg/dL (ref 70–99)
Potassium: 3.6 mmol/L (ref 3.5–5.1)
Sodium: 137 mmol/L (ref 135–145)

## 2021-11-29 LAB — SURGICAL PATHOLOGY

## 2021-11-29 MED ORDER — TRAMADOL HCL 50 MG PO TABS: 50.0000 mg | ORAL_TABLET | Freq: Four times a day (QID) | ORAL | 0 refills | Status: AC | PRN

## 2021-11-29 NOTE — Discharge Instructions (Signed)
SURGERY: POST OP INSTRUCTIONS (Surgery for small bowel obstruction, colon resection, etc)   ######################################################################  EAT Gradually transition to a high fiber diet with a fiber supplement over the next few days after discharge  WALK Walk an hour a day.  Control your pain to do that.    CONTROL PAIN Control pain so that you can walk, sleep, tolerate sneezing/coughing, go up/down stairs.  HAVE A BOWEL MOVEMENT DAILY Keep your bowels regular to avoid problems.  OK to try a laxative to override constipation.  OK to use an antidairrheal to slow down diarrhea.  Call if not better after 2 tries  CALL IF YOU HAVE PROBLEMS/CONCERNS Call if you are still struggling despite following these instructions. Call if you have concerns not answered by these instructions  ######################################################################   DIET Follow a light diet the first few days at home.  Start with a bland diet such as soups, liquids, starchy foods, low fat foods, etc.  If you feel full, bloated, or constipated, stay on a ful liquid or pureed/blenderized diet for a few days until you feel better and no longer constipated. Be sure to drink plenty of fluids every day to avoid getting dehydrated (feeling dizzy, not urinating, etc.). Gradually add a fiber supplement to your diet over the next week.  Gradually get back to a regular solid diet.  Avoid fast food or heavy meals the first week as you are more likely to get nauseated. It is expected for your digestive tract to need a few months to get back to normal.  It is common for your bowel movements and stools to be irregular.  You will have occasional bloating and cramping that should eventually fade away.  Until you are eating solid food normally, off all pain medications, and back to regular activities; your bowels will not be normal. Focus on eating a low-fat, high fiber diet the rest of your life  (See Getting to Good Bowel Health, below).  CARE of your INCISION or WOUND  It is good for closed incisions and even open wounds to be washed every day.  Shower every day.  Short baths are fine.  Wash the incisions and wounds clean with soap & water.    You may leave closed incisions open to air if it is dry.   You may cover the incision with clean gauze & replace it after your daily shower for comfort.  STAPLES: You have skin staples.  Leave them in place & set up an appointment for them to be removed by a surgery office nurse ~10 days after surgery. = 1st week of January 2024    ACTIVITIES as tolerated Start light daily activities --- self-care, walking, climbing stairs-- beginning the day after surgery.  Gradually increase activities as tolerated.  Control your pain to be active.  Stop when you are tired.  Ideally, walk several times a day, eventually an hour a day.   Most people are back to most day-to-day activities in a few weeks.  It takes 4-8 weeks to get back to unrestricted, intense activity. If you can walk 30 minutes without difficulty, it is safe to try more intense activity such as jogging, treadmill, bicycling, low-impact aerobics, swimming, etc. Save the most intensive and strenuous activity for last (Usually 4-8 weeks after surgery) such as sit-ups, heavy lifting, contact sports, etc.  Refrain from any intense heavy lifting or straining until you are off narcotics for pain control.  You will have off days, but things should improve   week-by-week. DO NOT PUSH THROUGH PAIN.  Let pain be your guide: If it hurts to do something, don't do it.  Pain is your body warning you to avoid that activity for another week until the pain goes down. You may drive when you are no longer taking narcotic prescription pain medication, you can comfortably wear a seatbelt, and you can safely make sudden turns/stops to protect yourself without hesitating due to pain. You may have sexual intercourse when it  is comfortable. If it hurts to do something, stop.  MEDICATIONS Take your usually prescribed home medications unless otherwise directed.   Blood thinners:  Usually you can restart any strong blood thinners after the second postoperative day.  It is OK to take aspirin right away.     If you are on strong blood thinners (warfarin/Coumadin, Plavix, Xerelto, Eliquis, Pradaxa, etc), discuss with your surgeon, medicine PCP, and/or cardiologist for instructions on when to restart the blood thinner & if blood monitoring is needed (PT/INR blood check, etc).     PAIN CONTROL Pain after surgery or related to activity is often due to strain/injury to muscle, tendon, nerves and/or incisions.  This pain is usually short-term and will improve in a few months.  To help speed the process of healing and to get back to regular activity more quickly, DO THE FOLLOWING THINGS TOGETHER: Increase activity gradually.  DO NOT PUSH THROUGH PAIN Use Ice and/or Heat Try Gentle Massage and/or Stretching Take over the counter pain medication Take Narcotic prescription pain medication for more severe pain  Good pain control = faster recovery.  It is better to take more medicine to be more active than to stay in bed all day to avoid medications.  Increase activity gradually Avoid heavy lifting at first, then increase to lifting as tolerated over the next 6 weeks. Do not "push through" the pain.  Listen to your body and avoid positions and maneuvers than reproduce the pain.  Wait a few days before trying something more intense Walking an hour a day is encouraged to help your body recover faster and more safely.  Start slowly and stop when getting sore.  If you can walk 30 minutes without stopping or pain, you can try more intense activity (running, jogging, aerobics, cycling, swimming, treadmill, sex, sports, weightlifting, etc.) Remember: If it hurts to do it, then don't do it! Use Ice and/or Heat You will have swelling and  bruising around the incisions.  This will take several weeks to resolve. Ice packs or heating pads (6-8 times a day, 30-60 minutes at a time) will help sooth soreness & bruising. Some people prefer to use ice alone, heat alone, or alternate between ice & heat.  Experiment and see what works best for you.  Consider trying ice for the first few days to help decrease swelling and bruising; then, switch to heat to help relax sore spots and speed recovery. Shower every day.  Short baths are fine.  It feels good!  Keep the incisions and wounds clean with soap & water.   Try Gentle Massage and/or Stretching Massage at the area of pain many times a day Stop if you feel pain - do not overdo it Take over the counter pain medication This helps the muscle and nerve tissues become less irritable and calm down faster Choose ONE of the following over-the-counter anti-inflammatory medications: Acetaminophen 500mg tabs (Tylenol) 1-2 pills with every meal and just before bedtime (avoid if you have liver problems or if you have   acetaminophen in you narcotic prescription) Naproxen 220mg tabs (ex. Aleve, Naprosyn) 1-2 pills twice a day (avoid if you have kidney, stomach, IBD, or bleeding problems) Ibuprofen 200mg tabs (ex. Advil, Motrin) 3-4 pills with every meal and just before bedtime (avoid if you have kidney, stomach, IBD, or bleeding problems) Take with food/snack several times a day as directed for at least 2 weeks to help keep pain / soreness down & more manageable. Take Narcotic prescription pain medication for more severe pain A prescription for strong pain control is often given to you upon discharge (for example: oxycodone/Percocet, hydrocodone/Norco/Vicodin, or tramadol/Ultram) Take your pain medication as prescribed. Be mindful that most narcotic prescriptions contain Tylenol (acetaminophen) as well - avoid taking too much Tylenol. If you are having problems/concerns with the prescription medicine (does  not control pain, nausea, vomiting, rash, itching, etc.), please call us [phone] to see if we need to switch you to a different pain medicine that will work better for you and/or control your side effects better. If you need a refill on your pain medication, you must call the office before 4 pm and on weekdays only.  By federal law, prescriptions for narcotics cannot be called into a pharmacy.  They must be filled out on paper & picked up from our office by the patient or authorized caretaker.  Prescriptions cannot be filled after 4 pm nor on weekends.    WHEN TO CALL US [phone] Severe uncontrolled or worsening pain  Fever over 101 F (38.5 C) Concerns with the incision: Worsening pain, redness, rash/hives, swelling, bleeding, or drainage Reactions / problems with new medications (itching, rash, hives, nausea, etc.) Nausea and/or vomiting Difficulty urinating Difficulty breathing Worsening fatigue, dizziness, lightheadedness, blurred vision Other concerns If you are not getting better after two weeks or are noticing you are getting worse, contact our office [phone] for further advice.  We may need to adjust your medications, re-evaluate you in the office, send you to the emergency room, or see what other things we can do to help. The clinic staff is available to answer your questions during regular business hours (8:30am-5pm).  Please don't hesitate to call and ask to speak to one of our nurses for clinical concerns.    A surgeon from [facility] is always on call at the hospitals 24 hours/day If you have a medical emergency, go to the nearest emergency room or call 911.  FOLLOW UP in our office One the day of your discharge from the hospital (or the next business weekday), please call [facility] to set up or confirm an appointment to see your surgeon in the office for a follow-up appointment.  Usually it is 2-3 weeks after your surgery.   If you  have skin staples at your incision(s), let the office know so we can set up a time in the office for the nurse to remove them (usually around 10 days after surgery). Make sure that you call for appointments the day of discharge (or the next business weekday) from the hospital to ensure a convenient appointment time. IF YOU HAVE DISABILITY OR FAMILY LEAVE FORMS, BRING THEM TO THE OFFICE FOR PROCESSING.  DO NOT GIVE THEM TO YOUR DOCTOR.  [facility] [address], [address], [place], [place]  [address] ? [phone] - Main [phone] - Toll Free,  [phone] - Fax [email or web address]    GETTING TO GOOD BOWEL HEALTH. It is expected for your digestive tract to   need a few months to get back to normal.  It is common for your bowel movements and stools to be irregular.  You will have occasional bloating and cramping that should eventually fade away.  Until you are eating solid food normally, off all pain medications, and back to regular activities; your bowels will not be normal.   Avoiding constipation The goal: ONE SOFT BOWEL MOVEMENT A DAY!    Drink plenty of fluids.  Choose water first. TAKE A FIBER SUPPLEMENT EVERY DAY THE REST OF YOUR LIFE During your first week back home, gradually add back a fiber supplement every day Experiment which form you can tolerate.   There are many forms such as powders, tablets, wafers, gummies, etc Psyllium bran (Metamucil), methylcellulose (Citrucel), Miralax or Glycolax, Benefiber, Flax Seed.  Adjust the dose week-by-week (1/2 dose/day to 6 doses a day) until you are moving your bowels 1-2 times a day.  Cut back the dose or try a different fiber product if it is giving you problems such as diarrhea or bloating. Sometimes a laxative is needed to help jump-start bowels if constipated until the fiber supplement can help regulate your bowels.  If you are tolerating eating & you are farting, it is okay to try a gentle  laxative such as double dose MiraLax, prune juice, or Milk of Magnesia.  Avoid using laxatives too often. Stool softeners can sometimes help counteract the constipating effects of narcotic pain medicines.  It can also cause diarrhea, so avoid using for too long. If you are still constipated despite taking fiber daily, eating solids, and a few doses of laxatives, call our office. Controlling diarrhea Try drinking liquids and eating bland foods for a few days to avoid stressing your intestines further. Avoid dairy products (especially milk & ice cream) for a short time.  The intestines often can lose the ability to digest lactose when stressed. Avoid foods that cause gassiness or bloating.  Typical foods include beans and other legumes, cabbage, broccoli, and dairy foods.  Avoid greasy, spicy, fast foods.  Every person has some sensitivity to other foods, so listen to your body and avoid those foods that trigger problems for you. Probiotics (such as active yogurt, Align, etc) may help repopulate the intestines and colon with normal bacteria and calm down a sensitive digestive tract Adding a fiber supplement gradually can help thicken stools by absorbing excess fluid and retrain the intestines to act more normally.  Slowly increase the dose over a few weeks.  Too much fiber too soon can backfire and cause cramping & bloating. It is okay to try and slow down diarrhea with a few doses of antidiarrheal medicines.   Bismuth subsalicylate (ex. Kayopectate, Pepto Bismol) for a few doses can help control diarrhea.  Avoid if pregnant.   Loperamide (Imodium) can slow down diarrhea.  Start with one tablet (2mg) first.  Avoid if you are having fevers or severe pain.  ILEOSTOMY PATIENTS WILL HAVE CHRONIC DIARRHEA since their colon is not in use.    Drink plenty of liquids.  You will need to drink even more glasses of water/liquid a day to avoid getting dehydrated. Record output from your ileostomy.  Expect to empty  the bag every 3-4 hours at first.  Most people with a permanent ileostomy empty their bag 4-6 times at the least.   Use antidiarrheal medicine (especially Imodium) several times a day to avoid getting dehydrated.  Start with a dose at bedtime & breakfast.  Adjust up or   down as needed.  Increase antidiarrheal medications as directed to avoid emptying the bag more than 8 times a day (every 3 hours). Work with your wound ostomy nurse to learn care for your ostomy.  See ostomy care instructions. TROUBLESHOOTING IRREGULAR BOWELS 1) Start with a soft & bland diet. No spicy, greasy, or fried foods.  2) Avoid gluten/wheat or dairy products from diet to see if symptoms improve. 3) Miralax 17gm or flax seed mixed in 8oz. water or juice-daily. May use 2-4 times a day as needed. 4) Gas-X, Phazyme, etc. as needed for gas & bloating.  5) Prilosec (omeprazole) over-the-counter as needed 6)  Consider probiotics (Align, Activa, etc) to help calm the bowels down  Call your doctor if you are getting worse or not getting better.  Sometimes further testing (cultures, endoscopy, X-ray studies, CT scans, bloodwork, etc.) may be needed to help diagnose and treat the cause of the diarrhea. [facility] [address], [address], [place], [place]  [address] [phone] - Main.    [phone]  - Toll Free.   [phone] - Fax [email or web address]   ###############################   #######################################################  Ostomy Support Information  You've heard that people get along just fine with only one of their eyes, or one of their lungs, or one of their kidneys. But you also know that you have only one intestine and only one bladder, and that leaves you feeling awfully empty, both physically and emotionally: You think no other people go around without part of their intestine with the ends of their intestines sticking out through their abdominal walls.    YOU ARE NOT ALONE.  There are nearly three quarters of a million people in the [place] who have an ostomy; people who have had surgery to remove all or part of their colons or bladders.   There is even a national association, the [employer] with over 350 local affiliated support groups that are organized by volunteers who provide peer support and counseling. [employer] has a toll free telephone num-ber, 800-826-0826 and an educational, interactive website, www.ostomy.org   An ostomy is an opening in the belly (abdominal Beebe) made by surgery. Ostomates are people who have had this procedure. The opening (stoma) allows the kidney or bowel to grdischarge waste. An external pouch covers the stoma to collect waste. Pouches are are a simple bag and are odor free. Different companies have disposable or reusable pouches to fit one's lifestyle. An ostomy can either be temporary or permanent.   THERE ARE THREE MAIN TYPES OF OSTOMIES Colostomy. A colostomy is a surgically created opening in the large intestine (colon). Ileostomy. An ileostomy is a surgically created opening in the small intestine. Urostomy. A urostomy is a surgically created opening to divert urine away from the bladder.  OSTOMY Care  The following guidelines will make care of your colostomy easier. Keep this information close by for quick reference.  Helpful DIET hints Eat a well-balanced diet including vegetables and fresh fruits. Eat on a regular schedule.  Drink at least 6 to 8 glasses of fluids daily. Eat slowly in a relaxed atmosphere. Chew your food thoroughly. Avoid chewing gum, smoking, and drinking from a straw. This will help decrease the amount of air you swallow, which may help reduce gas. Eating yogurt or drinking buttermilk may help reduce gas.  To control gas at night, do not eat after 8 p.m. This will give your bowel time to quiet down before you go   to bed.  If gas is a problem, you can purchase  Beano. Sprinkle Beano on the first bite of food before eating to reduce gas. It has no flavor and should not change the taste of your food. You can buy Beano over the counter at your local drugstore.  Foods like fish, onions, garlic, broccoli, asparagus, and cabbage produce odor. Although your pouch is odor-proof, if you eat these foods you may notice a stronger odor when emptying your pouch. If this is a concern, you may want to limit these foods in your diet.  If you have an ileostomy, you will have chronic diarrhea & need to drink more liquids to avoid getting dehydrated.  Consider antidiarrheal medicine like imodium (loperamide) or Lomotil to help slow down bowel movements / diarrhea into your ileostomy bag.  GETTING TO GOOD BOWEL HEALTH WITH AN ILEOSTOMY    With the colon bypassed & not in use, you will have small bowel diarrhea.   It is important to thicken & slow your bowel movements down.   The goal: 4-6 small BOWEL MOVEMENTS A DAY It is important to drink plenty of liquids to avoid getting dehydrated  CONTROLLING ILEOSTOMY DIARRHEA  TAKE A FIBER SUPPLEMENT (FiberCon or Benefiner soluble fiber) twice a day - to thicken stools by absorbing excess fluid and retrain the intestines to act more normally.  Slowly increase the dose over a few weeks.  Too much fiber too soon can backfire and cause cramping & bloating.  TAKE AN IRON SUPPLEMENT twice a day to naturally constipate your bowels.  Usually ferrous sulfate 325mg twice a day)  TAKE ANTI-DIARRHEAL MEDICINES: Loperamide (Imodium) can slow down diarrhea.  Start with two tablets (= 4mg) first and then try one tablet every 6 hours.  Can go up to 2 pills four times day (8 pills of 2mg max) Avoid if you are having fevers or severe pain.  If you are not better or start feeling worse, stop all medicines and call your doctor for advice LoMotil (Diphenoxylate / Atropine) is another medicine that can constipate & slow down bowel moevements Pepto  Bismol (bismuth) can gently thicken bowels as well  If diarrhea is worse,: drink plenty of liquids and try simpler foods for a few days to avoid stressing your intestines further. Avoid dairy products (especially milk & ice cream) for a short time.  The intestines often can lose the ability to digest lactose when stressed. Avoid foods that cause gassiness or bloating.  Typical foods include beans and other legumes, cabbage, broccoli, and dairy foods.  Every person has some sensitivity to other foods, so listen to our body and avoid those foods that trigger problems for you.Call your doctor if you are getting worse or not better.  Sometimes further testing (cultures, endoscopy, X-ray studies, bloodwork, etc) may be needed to help diagnose and treat the cause of the diarrhea. Take extra anti-diarrheal medicines (maximum is 8 pills of 2mg loperamide a day)   Tips for POUCHING an OSTOMY   Changing Your Pouch The best time to change your pouch is in the morning, before eating or drinking anything. Your stoma can function at any time, but it will function more after eating or drinking.   Applying the pouching system  Place all your equipment close at hand before removing your pouch.  Wash your hands.  Stand or sit in front of a mirror. Use the position that works best for you. Remember that you must keep the skin around the stoma   wrinkle-free for a good seal.  Gently remove the used pouch (1-piece system) or the pouch and old wafer (2-piece system). Empty the pouch into the toilet. Save the closure clip to use again.  Wash the stoma itself and the skin around the stoma. Your stoma may bleed a little when being washed. This is normal. Rinse and pat dry. You may use a wash cloth or soft paper towels (like Bounty), mild soap (like Dial, Safeguard, or Ivory), and water. Avoid soaps that contain perfumes or lotions.  For a new pouch (1-piece system) or a new wafer (2-piece system), measure your  stoma using the stoma guide in each box of supplies.  Trace the shape of your stoma onto the back of the new pouch or the back of the new wafer. Cut out the opening. Remove the paper backing and set it aside.  Optional: Apply a skin barrier powder to surrounding skin if it is irritated (bare or weeping), and dust off the excess. Optional: Apply a skin-prep wipe (such as Skin Prep or All-Kare) to the skin around the stoma, and let it dry. Do not apply this solution if the skin is irritated (red, tender, or broken) or if you have shaved around the stoma. Optional: Apply a skin barrier paste (such as Stomahesive, Coloplast, or Premium) around the opening cut in the back of the pouch or wafer. Allow it to dry for 30 to 60 seconds.  Hold the pouch (1-piece system) or wafer (2-piece system) with the sticky side toward your body. Make sure the skin around the stoma is wrinkle-free. Center the opening on the stoma, then press firmly to your abdomen (Fig. 4). Look in the mirror to check if you are placing the pouch, or wafer, in the right position. For a 2-piece system, snap the pouch onto the wafer. Make sure it snaps into place securely.  Place your hand over the stoma and the pouch or wafer for about 30 seconds. The heat from your hand can help the pouch or wafer stick to your skin.  Add deodorant (such as Super Banish or Nullo) to your pouch. Other options include food extracts such as vanilla oil and peppermint extract. Add about 10 drops of the deodorant to the pouch. Then apply the closure clamp. Note: Do not use toxic  chemicals or commercial cleaning agents in your pouch. These substances may harm the stoma.  Optional: For extra seal, apply tape to all 4 sides around the pouch or wafer, as if you were framing a picture. You may use any brand of medical adhesive tape. Change your pouch every 5 to 7 days. Change it immediately if a leak occurs.  Wash your hands afterwards.  If you are wearing a  2-piece system, you may use 2 new pouches per week and alternate them. Rinse the pouch with mild soap and warm water and hang it to dry for the next day. Apply the fresh pouch. Alternate the 2 pouches like this for a week. After a week, change the wafer and begin with 2 new pouches. Place the old pouches in a plastic bag, and put them in the trash.   LIVING WITH AN OSTOMY  Emptying Your Pouch Empty your pouch when it is one-third full (of urine, stool, and/or gas). If you wait until your pouch is fuller than this, it will be more difficult to empty and more noticeable. When you empty your pouch, either put toilet paper in the toilet bowl first, or flush the   toilet while you empty the pouch. This will reduce splashing. You can empty the pouch between your legs or to one side while sitting, or while standing or stooping. If you have a 2-piece system, you can snap off the pouch to empty it. Remember that your stoma may function during this time. If you wish to rinse your pouch after you empty it, a turkey baster can be helpful. When using a baster, squirt water up into the pouch through the opening at the bottom. With a 2-piece system, you can snap off the pouch to rinse it. After rinsing  your pouch, empty it into the toilet. When rinsing your pouch at home, put a few granules of Dreft soap in the rinse water. This helps lubricate and freshen your pouch. The inside of your pouch can be sprayed with non-stick cooking oil (Pam spray). This may help reduce stool sticking to the inside of the pouch.  Bathing You may shower or bathe with your pouch on or off. Remember that your stoma may function during this time.  The materials you use to wash your stoma and the skin around it should be clean, but they do not need to be sterile.  Wearing Your Pouch During hot weather, or if you perspire a lot in general, wear a cover over your pouch. This may prevent a rash on your skin under the pouch. Pouch covers are  sold at ostomy supply stores. Wear the pouch inside your underwear for better support. Watch your weight. Any gain or loss of 10 to 15 pounds or more can change the way your pouch fits.  Going Away From Home A collapsible cup (like those that come in travel kits) or a soft plastic squirt bottle with a pull-up top (like a travel bottle for shampoo) can be used for rinsing your pouch when you are away from home. Tilt the opening of the pouch at an upward angle when using a cup to rinse.  Carry wet wipes or extra tissues to use in public bathrooms.  Carry an extra pouching system with you at all times.  Never keep ostomy supplies in the glove compartment of your car. Extreme heat or cold can damage the skin barriers and adhesive wafers on the pouch.  When you travel, carry your ostomy supplies with you at all times. Keep them within easy reach. Do not pack ostomy supplies in baggage that will be checked or otherwise separated from you, because your baggage might be lost. If you're traveling out of the country, it is helpful to have a letter stating that you are carrying ostomy supplies as a medical necessity.  If you need ostomy supplies while traveling, look in the yellow pages of the telephone book under "Surgical Supplies." Or call the local ostomy organization to find out where supplies are available.  Do not let your ostomy supplies get low. Always order new pouches before you use the last one.  Reducing Odor Limit foods such as broccoli, cabbage, onions, fish, and garlic in your diet to help reduce odor. Each time you empty your pouch, carefully clean the opening of the pouch, both inside and outside, with toilet paper. Rinse your pouch 1 or 2 times daily after you empty it (see directions for emptying your pouch and going away from home). Add deodorant (such as Super Banish or Nullo) to your pouch. Use air deodorizers in your bathroom. Do not add aspirin to your pouch. Even though  aspirin can help prevent odor, it   could cause ulcers on your stoma.  When to call the doctor Call the doctor if you have any of the following symptoms: Purple, black, or white stoma Severe cramps lasting more than 6 hours Severe watery discharge from the stoma lasting more than 6 hours No output from the colostomy for 3 days Excessive bleeding from your stoma Swelling of your stoma to more than 1/2-inch larger than usual Pulling inward of your stoma below skin level Severe skin irritation or deep ulcers Bulging or other changes in your abdomen  When to call your ostomy nurse Call your ostomy/enterostomal therapy (WOCN) nurse if any of the following occurs: Frequent leaking of your pouching system Change in size or appearance of your stoma, causing discomfort or problems with your pouch Skin rash or rawness Weight gain or loss that causes problems with your pouch     FREQUENTLY ASKED QUESTIONS   Why haven't you met any of these folks who have an ostomy?  Well, maybe you have! You just did not recognize them because an ostomy doesn't show. It can be kept secret if you wish. Why, maybe some of your best friends, office associates or neighbors have an ostomy ... you never can tell. People facing ostomy surgery have many quality-of-life questions like: Will you bulge? Smell? Make noises? Will you feel waste leaving your body? Will you be a captive of the toilet? Will you starve? Be a social outcast? Get/stay married? Have babies? Easily bathe, go swimming, bend over?  OK, let's look at what you can expect:   Will you bulge?  Remember, without part of the intestine or bladder, and its contents, you should have a flatter tummy than before. You can expect to wear, with little exception, what you wore before surgery ... and this in-cludes tight clothing and bathing suits.   Will you smell?  Today, thanks to modern odor proof pouching systems, you can walk into an ostomy support group  meeting and not smell anything that is foul or offensive. And, for those with an ileostomy or colostomy who are concerned about odor when emptying their pouch, there are in-pouch deodorants that can be used to eliminate any waste odors that may exist.   Will you make noises?  Everyone produces gas, especially if they are an air-swallower. But intestinal sounds that occur from time to time are no differ-ent than a gurgling tummy, and quite often your clothing will muffle any sounds.   Will you feel the waste discharges?  For those with a colostomy or ileostomy there might be a slight pressure when waste leaves your body, but understand that the intestines have no nerve endings, so there will be no unpleasant sensations. Those with a urostomy will probably be unaware of any kidney drainage.   Will you be a captive of the toilet?  Immediately post-op you will spend more time in the bathroom than you will after your body recovers from surgery. Every person is different, but on average those with an ileostomy or urostomy may empty their pouches 4 to 6 times a day; a little  less if you have a colostomy. The average wear time between pouch system changes is 3 to 5 days and the changing process should take less than 30 minutes.   Will I need to be on a special diet? Most people return to their normal diet when they have recovered from surgery. Be sure to chew your food well, eat a well-balanced diet and drink plenty of fluids. If   you experience problems with a certain food, wait a couple of weeks and try it again.  Will there be odor and noises? Pouching systems are designed to be odor-proof or odor-resistant. There are deodorants that can be used in the pouch. Medications are also available to help reduce odor. Limit gas-producing foods and carbonated beverages. You will experience less gas and fewer noises as you heal from surgery.  How much time will it take to care for my ostomy? At first, you may  spend a lot of time learning about your ostomy and how to take care of it. As you become more comfortable and skilled at changing the pouching system, it will take very little time to care for it.   Will I be able to return to work? People with ostomies can perform most jobs. As soon as you have healed from surgery, you should be able to return to work. Heavy lifting (more than 10 pounds) may be discouraged.   What about intimacy? Sexual relationships and intimacy are important and fulfilling aspects of your life. They should continue after ostomy surgery. Intimacy-related concerns should be discussed openly between you and your partner.   Can I wear regular clothing? You do not need to wear special clothing. Ostomy pouches are fairly flat and barely noticeable. Elastic undergarments will not hurt the stoma or prevent the ostomy from functioning.   Can I participate in sports? An ostomy should not limit your involvement in sports. Many people with ostomies are runners, skiers, swimmers or participate in other active lifestyles. Talk with your caregiver first before doing heavy physical activity.  Will you starve?  Not if you follow doctor's orders at each stage of your post-op adjustment. There is no such thing as an "ostomy diet". Some people with an ostomy will be able to eat and tolerate anything; others may find diffi-culty with some foods. Each person is an individual and must determine, by trial, what is best for them. A good practice for all is to drink plenty of water.   Will you be a social outcast?  Have you met anyone who has an ostomy and is a social outcast? Why should you be the first? Only your attitude and self image will effect how you are treated. No confi-dent person is an outcast.    PROFESSIONAL HELP   Resources are available if you need help or have questions about your ostomy.   Specially trained nurses called Wound, Ostomy Continence Nurses (WOCN) are available for  consultation in most major medical centers.  Consider getting an ostomy consult at an outpatient ostomy clinic.   [facility] has an [facility] Clinic run by an WOCN ostomy nurse at the [facility].  [phone]. [facility] can help set up an appointment   The [employer] Association (UOA) is a group made up of many local chapters throughout the [place]. These local groups hold meetings and provide support to prospective and existing ostomates. They sponsor educational events and have qualified visitors to make personal or telephone visits. Contact the UOA for the chapter nearest you and for other educational publications.  More detailed information can be found in Colostomy Guide, a publication of the [employer] Ostomy Association (UOA). Contact UOA at 1-800-826-0826 or visit their web site at www.uoaa.org. The website contains links to other sites, suppliers and resources.  [facility] Secure Start Services: Start at the website to enlist for support.  Your Wound Ostomy (WOCN) nurse may have started this   process. https://www.[employer].com/en/securestart Secure Start services are designed to support people as they live their lives with an ostomy or neurogenic bladder. Enrolling is easy and at no cost to the patient. We realize that each person's needs and life journey are different. Through Secure Start services, we want to help people live their life, their way.  #######################################################  

## 2021-11-29 NOTE — Progress Notes (Signed)
Patient was given discharge instructions, and all questions were answered. Patient was stable for discharge and was walked to the main exit. 

## 2021-11-29 NOTE — Discharge Summary (Signed)
Physician Discharge Summary  Patient ID: Jaime Thomas MRN: [id number] DOB/AGE: 1955/08/24 66 y.o.  Admit date: 11/27/2021 Discharge date: 11/29/2021  Admission Diagnoses: Colovaginal fistula  Discharge Diagnoses:  Principal Problem:   Colovaginal fistula   Discharged Condition: good  Hospital Course: Patient was admitted to the med surg floor after surgery.  Diet was advanced as tolerated.  Patient began to have bowel function on postop day 1.  By postop day 2, she was tolerating a solid diet and pain was controlled with oral medications.  She was urinating without difficulty and ambulating without assistance.  Patient was felt to be in stable condition for discharge to home.   Consults: None  Significant Diagnostic Studies: labs: cbc, bmet  Treatments: IV hydration, analgesia: acetaminophen, and surgery: Robotic Sigmoidectomy  Discharge Exam: Blood pressure 114/75, pulse 83, temperature 99.3 F (37.4 C), resp. rate 18, height '5\' 9"'$  (1.753 m), weight 86.5 kg, SpO2 96 %. General appearance: alert and cooperative GI: soft, nontender, nondistended Incision/Wound: clean, dry, intact  Disposition: Discharge disposition: 01-Home or Self Care        Allergies as of 11/29/2021   No Known Allergies      Medication List     TAKE these medications    acetaminophen 500 MG tablet Commonly known as: TYLENOL Take 2 tablets (1,000 mg total) by mouth every 6 (six) hours as needed. What changed: reasons to take this   albuterol 108 (90 Base) MCG/ACT inhaler Commonly known as: VENTOLIN HFA Inhale 1 puff into the lungs every 6 (six) hours as needed for wheezing or shortness of breath.   atorvastatin 10 MG tablet Commonly known as: LIPITOR Take 10 mg by mouth daily.   hydrochlorothiazide 25 MG tablet Commonly known as: HYDRODIURIL Take 25 mg by mouth daily.   montelukast 10 MG tablet Commonly known as: SINGULAIR Take 10 mg by mouth at bedtime.   multivitamin with  minerals Tabs tablet Take 1 tablet by mouth daily.   potassium chloride 10 MEQ tablet Commonly known as: KLOR-CON Take 10 mEq by mouth daily.   traMADol 50 MG tablet Commonly known as: ULTRAM Take 1-2 tablets (50-100 mg total) by mouth every 6 (six) hours as needed for moderate pain.   traZODone 50 MG tablet Commonly known as: DESYREL Take 50 mg by mouth at bedtime.        Follow-up Information     Leighton Ruff, MD. Schedule an appointment as soon as possible for a visit in 2 week(s).   Specialties: General Surgery, Colon and Rectal Surgery Contact information: [address] [place] [address] [phone]                 Signed: Rosario Adie 38/46/6599, 8:36 AM

## 2023-07-25 IMAGING — CT CT ABD-PELV W/ CM
2 of 5 series · 15 of 46 positions shown, 17 images · IV contrast (Omnipaque)
Comparison: Portable chest 10/26/2018.

CLINICAL DATA: 65-year-old female with abdominal and low back pain
with vomiting.

EXAM:
CT ABDOMEN AND PELVIS WITH CONTRAST
TECHNIQUE: Multidetector CT imaging of the abdomen and pelvis was performed
using the standard protocol following bolus administration of
intravenous contrast.
CONTRAST:  100mL OMNIPAQUE IOHEXOL 300 MG/ML  SOLN

[Series 2: axial st · axial · 0.98mm/px · z∈[+838,+1264]mm · 12 of 96 slices shown, 14 images]
[im 6/96  soft-tissue]
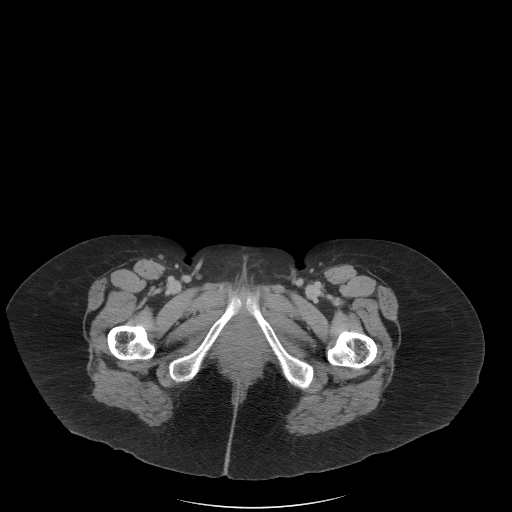
[im 6/96  bone]
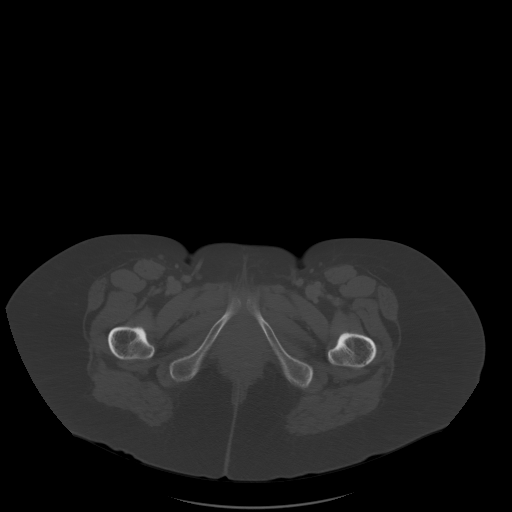
[im 16/96  soft-tissue]
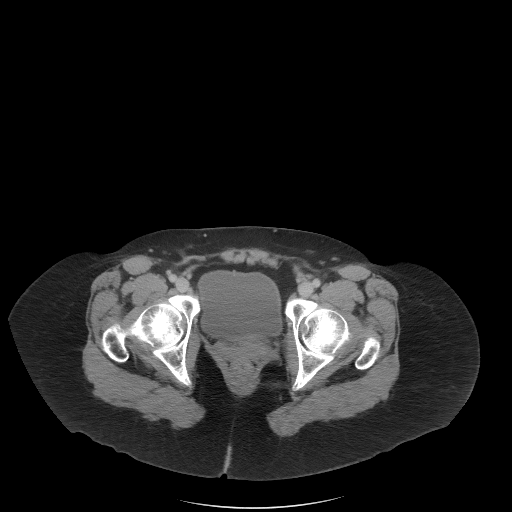
[im 21/96  soft-tissue]
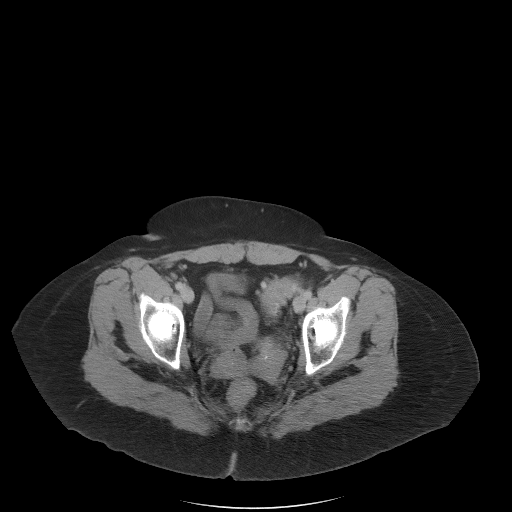
[im 31/96  soft-tissue]
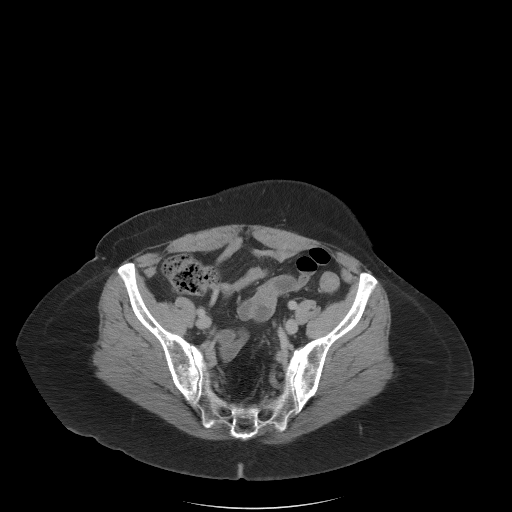
[im 36/96  soft-tissue]
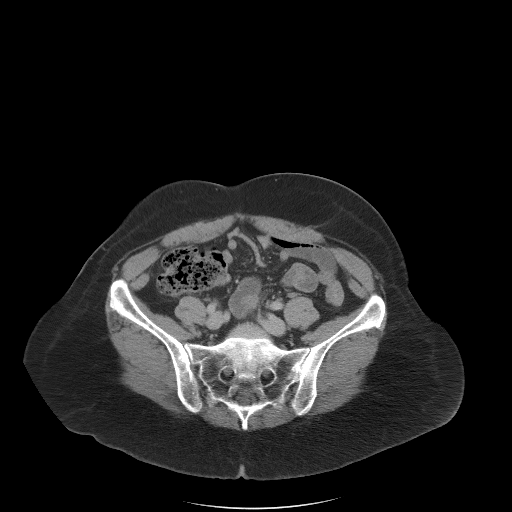
[im 46/96  soft-tissue]
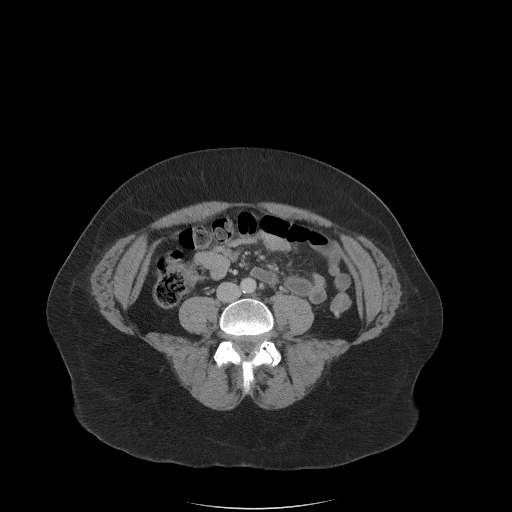
[im 51/96  soft-tissue]
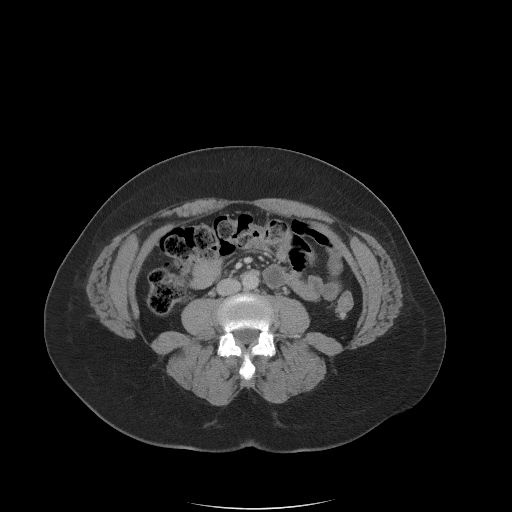
[im 61/96  soft-tissue]
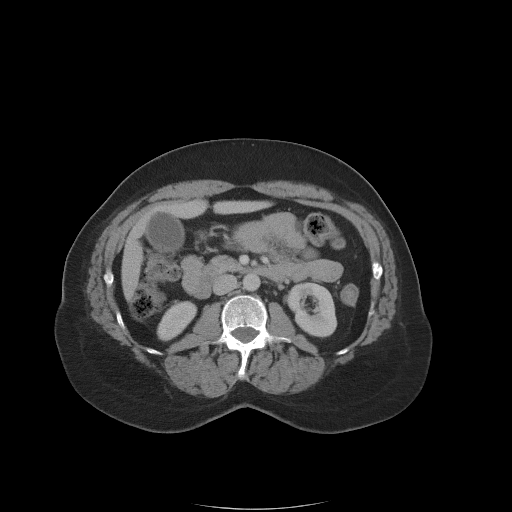
[im 66/96  soft-tissue]
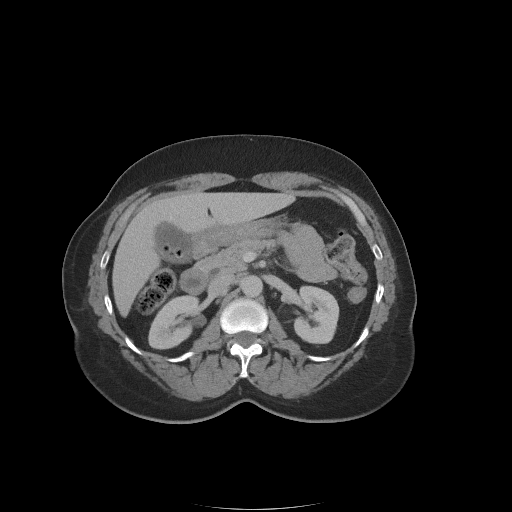
[im 66/96  bone]
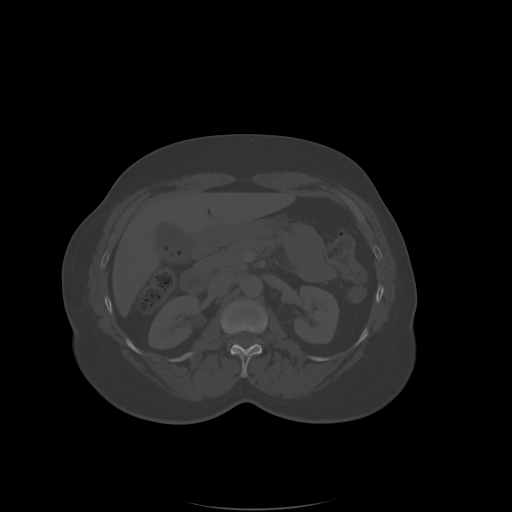
[im 76/96  soft-tissue]
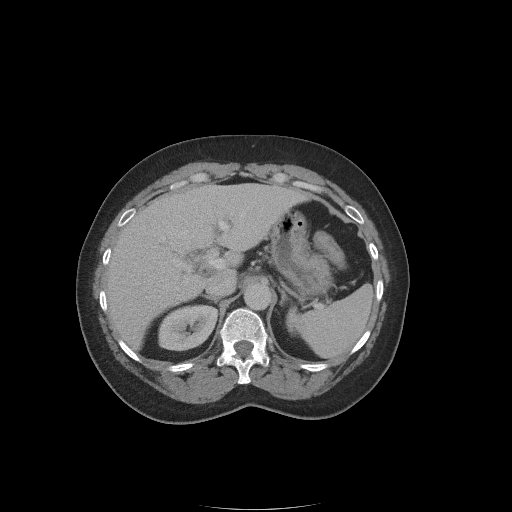
[im 81/96  soft-tissue]
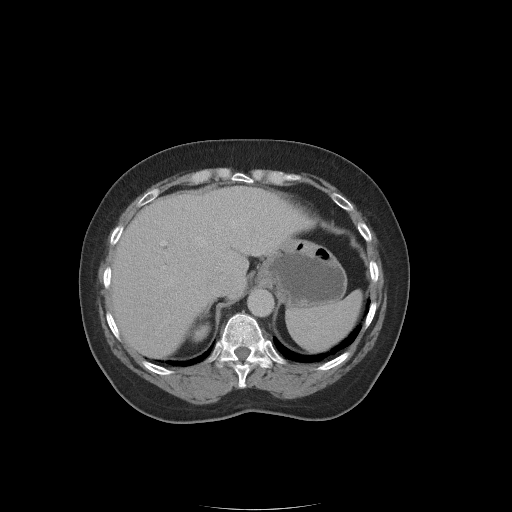
[im 91/96  soft-tissue]
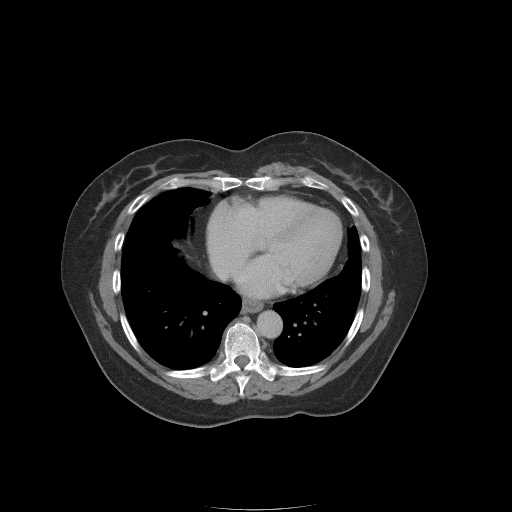

[Series 5: coronal st · coronal · 0.76mm/px · 3 of 96 slices shown]
[im 32/96  soft-tissue]
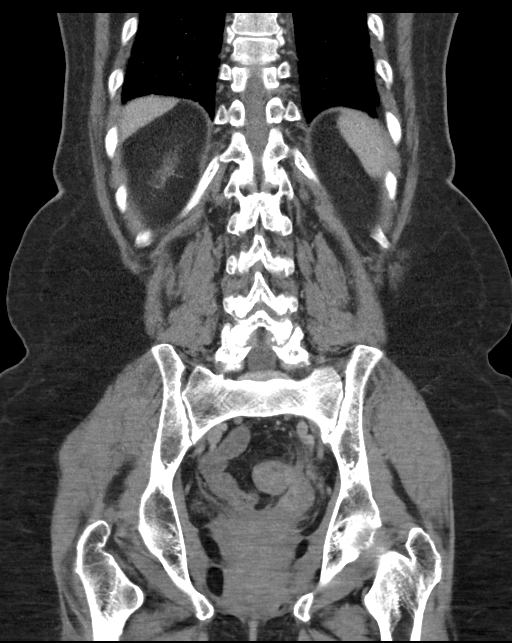
[im 43/96  soft-tissue]
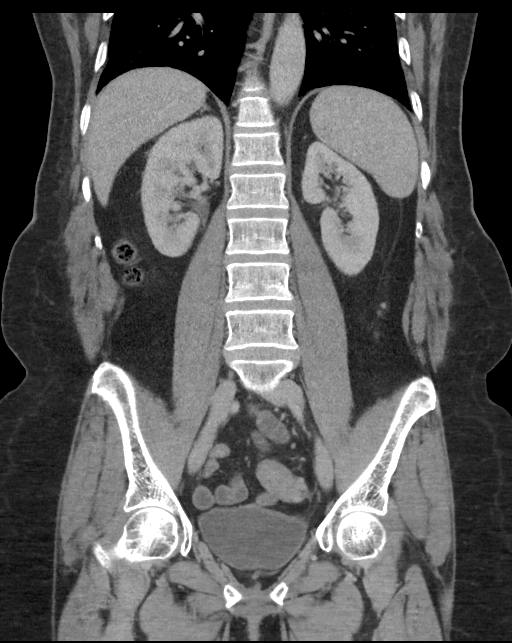
[im 53/96  soft-tissue]
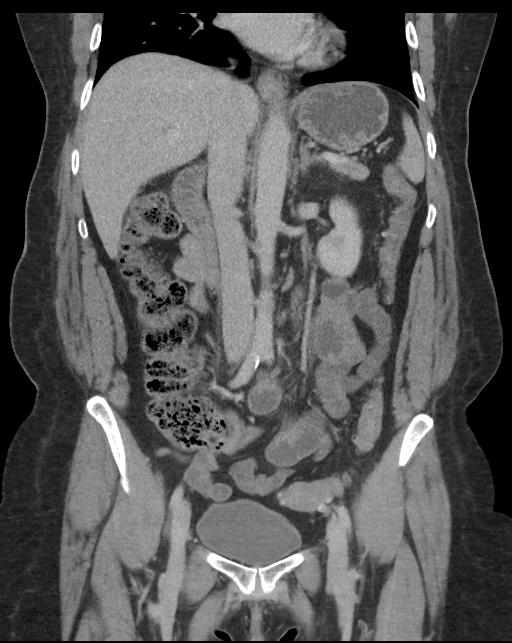

[15 of 46 positions shown; findings below may reference images not displayed]

FINDINGS: Lower chest: Cardiac size at the upper limits of normal. No
pericardial effusion. Mosaic attenuation in both lower lungs,
favored to be chronic with some evidence of underlying chronic
subpleural scarring. No pleural effusion.

Hepatobiliary: Extensive lipid laden gallstones throughout the
gallbladder, and appear especially concentrated at porta hepatis on
coronal image 59. Individual stone size estimated at 12 mm. The
gallbladder wall appears indistinct on series 2, image 35 and
coronal image 70. There is mild hepatic periportal edema or
intrahepatic mild ductal enlargement. The CBD is at the upper limits
of normal. Liver enhancement maintained.

Pancreas: Negative.

Spleen: Negative.

Adrenals/Urinary Tract: Negative. No nephrolithiasis or obstructive
uropathy. Bladder remains within normal limits.

Stomach/Bowel: Mostly decompressed rectum does contain a small
volume of fluid. Abnormal sigmoid colon in the central pelvis and
along the left pelvic side wall with moderate diverticulosis,
indistinct sigmoid wall thickening. Mesenteric inflammation
inseparable from the left adnexa on coronal image 34. No
extraluminal gas or discrete fluid.

Upstream the splenic flexure and descending colon also appear mildly
thickened and indistinct but there is no associated mesenteric
inflammation. Diverticulosis continues in those segments.

Transverse and right colon have a more normal appearance with
retained stool. Normal appendix on coronal image 52. Negative
terminal ileum. No dilated small bowel. Stomach and duodenum are
within normal limits. No free air or free fluid.

Vascular/Lymphatic: Mild Aortoiliac calcified atherosclerosis. Major
arterial structures are patent. No lymphadenopathy. Portal venous
system is patent.

Reproductive: Surgically absent uterus. The left adnexa is
inseparable from the abnormal sigmoid colon and appears secondarily
inflamed on coronal image 34, with left pelvic side wall soft tissue
thickening and stranding on series 2, image 73. Right adnexa within
normal limits.

Other: No pelvic free fluid.

Musculoskeletal: Lower lumbar facet degeneration. No acute or
suspicious osseous lesion.
IMPRESSION: 1. Abnormal descending and sigmoid colon compatible with Acute
Diverticulitis/Colitis. Inflammation maximal in the mid sigmoid
which appears adhered to the left adnexa which is secondarily
inflamed along the pelvic side wall. But no associated perforation,
abscess, or other complicating features.

2. But also abnormal Liver and Gallbladder. Numerous lipid laden
gallstones, with possible impacted stones at the gallbladder neck.
Mild gallbladder inflammation suspected and early biliary ductal
dilatation. Follow-up Right Upper Quadrant Ultrasound may be
valuable.

3. Chronic lung disease suspected at the lung bases. Aortic
Atherosclerosis ([id number]).
